# Patient Record
Sex: Female | Born: 1970 | Race: White | Hispanic: No | Marital: Married | Smoking: Former smoker
Health system: Southern US, Community
[De-identification: ages and names within clinical notes are randomized; demographics above are authoritative.]

## PROBLEM LIST (undated history)

## (undated) DIAGNOSIS — C801 Malignant (primary) neoplasm, unspecified: Secondary | ICD-10-CM

## (undated) DIAGNOSIS — F419 Anxiety disorder, unspecified: Secondary | ICD-10-CM

## (undated) DIAGNOSIS — Z8489 Family history of other specified conditions: Secondary | ICD-10-CM

## (undated) DIAGNOSIS — F32A Depression, unspecified: Secondary | ICD-10-CM

## (undated) DIAGNOSIS — F329 Major depressive disorder, single episode, unspecified: Secondary | ICD-10-CM

## (undated) DIAGNOSIS — J45909 Unspecified asthma, uncomplicated: Secondary | ICD-10-CM

---

## 1998-05-21 HISTORY — PX: WISDOM TOOTH EXTRACTION: SHX21

## 2001-09-03 ENCOUNTER — Other Ambulatory Visit: Admission: RE | Admit: 2001-09-03 | Discharge: 2001-09-03 | Payer: Self-pay | Admitting: *Deleted

## 2002-04-21 ENCOUNTER — Other Ambulatory Visit: Admission: RE | Admit: 2002-04-21 | Discharge: 2002-04-21 | Payer: Self-pay | Admitting: Obstetrics and Gynecology

## 2002-11-20 ENCOUNTER — Inpatient Hospital Stay (HOSPITAL_COMMUNITY): Admission: AD | Admit: 2002-11-20 | Discharge: 2002-11-22 | Payer: Self-pay | Admitting: Obstetrics and Gynecology

## 2003-01-01 ENCOUNTER — Other Ambulatory Visit: Admission: RE | Admit: 2003-01-01 | Discharge: 2003-01-01 | Payer: Self-pay | Admitting: Obstetrics and Gynecology

## 2005-02-09 ENCOUNTER — Inpatient Hospital Stay (HOSPITAL_COMMUNITY): Admission: AD | Admit: 2005-02-09 | Discharge: 2005-02-11 | Payer: Self-pay | Admitting: Obstetrics & Gynecology

## 2006-09-11 ENCOUNTER — Encounter: Admission: RE | Admit: 2006-09-11 | Discharge: 2006-09-11 | Payer: Self-pay | Admitting: Obstetrics and Gynecology

## 2007-04-01 ENCOUNTER — Encounter: Admission: RE | Admit: 2007-04-01 | Discharge: 2007-04-01 | Payer: Self-pay | Admitting: Family Medicine

## 2008-03-09 ENCOUNTER — Encounter: Admission: RE | Admit: 2008-03-09 | Discharge: 2008-03-09 | Payer: Self-pay | Admitting: Family Medicine

## 2008-03-17 ENCOUNTER — Ambulatory Visit (HOSPITAL_COMMUNITY): Admission: RE | Admit: 2008-03-17 | Discharge: 2008-03-17 | Payer: Self-pay | Admitting: Family Medicine

## 2010-05-16 ENCOUNTER — Encounter
Admission: RE | Admit: 2010-05-16 | Discharge: 2010-05-16 | Payer: Self-pay | Source: Home / Self Care | Attending: Obstetrics and Gynecology | Admitting: Obstetrics and Gynecology

## 2010-10-06 NOTE — H&P (Signed)
Lindsey Le, Lindsey Le                            ACCOUNT NO.:  [id number]   MEDICAL RECORD NO.:  [id number]                   PATIENT TYPE:  INP   LOCATION:  9174                                 FACILITY:  [facility]   PHYSICIAN:  Maxie Better, M.D.            DATE OF BIRTH:  09-06-1970   DATE OF ADMISSION:  11/20/2002  DATE OF DISCHARGE:                                HISTORY & PHYSICAL   CHIEF COMPLAINT:  Spontaneous rupture of membranes at 4 a.m., clear fluid.   HISTORY OF PRESENT ILLNESS:  This is a 40 year old gravida 1 para 0 married  white female; last menstrual period February 18, 2002; [facility] of November 26, 2002;  who is now at 39+ weeks gestation; admitted with spontaneous rupture of  membranes at 4 a.m. this morning.  Her group B strep culture is negative.  The patient had not have any formal perceived contractions.  She does note  low back pain and otherwise good fetal movements.  Her prenatal course has  been notable for size greater than dates with the last ultrasound done on  November 10, 2002 showed an estimated fetal weight of 7 pounds 12 ounces,  amniotic fluid index of 13.9.  Prenatal care is at [facility], primary  obstetrician Maxie Better, M.D.   PRENATAL LABORATORY DATA:  Blood type is A positive, antibody screen is  negative.  RPR is nonreactive.  Rubella is immune.  Hepatitis B surface  antigen is negative.  HIV test was negative.  GC and chlamydia cultures are  negative.  Pap was within normal limits.  AFP-3 test was normal on June 10, 2002.  Anatomic survey was done on July 14, 2002 with complete study  done on July 28, 2002.  Posterior placenta noted at that time.  Normal one-  hour glucose challenge test.  Group B strep culture negative.   PAST MEDICAL HISTORY:  1. Allergy to ERYTHROMYCIN and GRAPES.  2. Medicines: Prenatal vitamins.  3. Medical history:  Seasonal allergies, skin cancer removed from head.  4. Surgery:  Wisdom tooth extraction June  2001.   FAMILY HISTORY:  Maternal grandmother - breast cancer.  No ovarian or colon  cancer.  Paternal grandfather - lung cancer.   SOCIAL HISTORY:  Married, nonsmoker.  Coordinator at [employer]   REVIEW OF SYSTEMS:  Negative except as noted in history of present illness.   PHYSICAL EXAMINATION:  GENERAL:  Well-developed, well-nourished gravid white  female in no acute distress.  VITAL SIGNS:  Blood pressure 100/72, weight is 157, fetal heart rate of 138,  temperature of 98.5.  SKIN:  Shows no lesions.  HEENT:  Anicteric sclerae, pink conjunctivae, oropharynx negative.  HEART:  Regular rate and rhythm without murmur.  LUNGS:  Clear to auscultation.  BREASTS:  Soft and nontender, no palpable mass.  ABDOMEN:  Gravid, fundal height of 41 cm.  PELVIC:  Loose 4 cm, 70%, -  1.  Clear amniotic fluid noted.  EXTREMITIES:  Trace edema.   IMPRESSION:  Spontaneous rupture of membranes, term gestation.   PLAN:  1. Admission.  2. Routine admission labs.  3. Ambulate x1 hour; if no change, Pitocin augmentation.  4. Analgesics p.r.n.                                               Maxie Better, M.D.    [place]/MEDQ  D:  11/20/2002  T:  11/20/2002  Job:  [id number]

## 2013-12-04 ENCOUNTER — Other Ambulatory Visit: Payer: Self-pay

## 2013-12-04 DIAGNOSIS — Z1231 Encounter for screening mammogram for malignant neoplasm of breast: Secondary | ICD-10-CM

## 2013-12-21 ENCOUNTER — Ambulatory Visit
Admission: RE | Admit: 2013-12-21 | Discharge: 2013-12-21 | Disposition: A | Discharge status: Home / Self Care | Payer: BC Managed Care – PPO | Source: Ambulatory Visit

## 2013-12-21 ENCOUNTER — Encounter (INDEPENDENT_AMBULATORY_CARE_PROVIDER_SITE_OTHER): Payer: Self-pay

## 2013-12-21 DIAGNOSIS — Z1231 Encounter for screening mammogram for malignant neoplasm of breast: Secondary | ICD-10-CM

## 2017-08-13 ENCOUNTER — Other Ambulatory Visit: Payer: Self-pay | Admitting: Obstetrics and Gynecology

## 2017-08-21 ENCOUNTER — Other Ambulatory Visit: Payer: Self-pay

## 2017-08-21 ENCOUNTER — Encounter (HOSPITAL_COMMUNITY): Payer: Self-pay | Admitting: *Deleted

## 2017-08-30 ENCOUNTER — Ambulatory Visit (HOSPITAL_COMMUNITY): Payer: BLUE CROSS/BLUE SHIELD | Admitting: Registered Nurse

## 2017-08-30 ENCOUNTER — Encounter (HOSPITAL_COMMUNITY): Payer: Self-pay | Admitting: Emergency Medicine

## 2017-08-30 ENCOUNTER — Encounter (HOSPITAL_COMMUNITY)
Admission: RE | Disposition: A | Discharge status: Home / Self Care | Payer: Self-pay | Source: Ambulatory Visit | Attending: Obstetrics and Gynecology

## 2017-08-30 ENCOUNTER — Other Ambulatory Visit: Payer: Self-pay

## 2017-08-30 ENCOUNTER — Ambulatory Visit (HOSPITAL_COMMUNITY)
Admission: RE | Admit: 2017-08-30 | Discharge: 2017-08-30 | Disposition: A | Discharge status: Home / Self Care | Payer: BLUE CROSS/BLUE SHIELD | Source: Ambulatory Visit | Attending: Obstetrics and Gynecology | Admitting: Obstetrics and Gynecology

## 2017-08-30 DIAGNOSIS — F329 Major depressive disorder, single episode, unspecified: Secondary | ICD-10-CM | POA: Insufficient documentation

## 2017-08-30 DIAGNOSIS — N858 Other specified noninflammatory disorders of uterus: Secondary | ICD-10-CM | POA: Diagnosis not present

## 2017-08-30 DIAGNOSIS — Z79899 Other long term (current) drug therapy: Secondary | ICD-10-CM | POA: Insufficient documentation

## 2017-08-30 DIAGNOSIS — Z793 Long term (current) use of hormonal contraceptives: Secondary | ICD-10-CM | POA: Insufficient documentation

## 2017-08-30 DIAGNOSIS — J45909 Unspecified asthma, uncomplicated: Secondary | ICD-10-CM | POA: Insufficient documentation

## 2017-08-30 DIAGNOSIS — Z87891 Personal history of nicotine dependence: Secondary | ICD-10-CM | POA: Diagnosis not present

## 2017-08-30 DIAGNOSIS — Z85828 Personal history of other malignant neoplasm of skin: Secondary | ICD-10-CM | POA: Insufficient documentation

## 2017-08-30 DIAGNOSIS — N920 Excessive and frequent menstruation with regular cycle: Secondary | ICD-10-CM | POA: Diagnosis present

## 2017-08-30 DIAGNOSIS — F419 Anxiety disorder, unspecified: Secondary | ICD-10-CM | POA: Diagnosis not present

## 2017-08-30 DIAGNOSIS — D250 Submucous leiomyoma of uterus: Secondary | ICD-10-CM | POA: Diagnosis not present

## 2017-08-30 DIAGNOSIS — N921 Excessive and frequent menstruation with irregular cycle: Secondary | ICD-10-CM | POA: Insufficient documentation

## 2017-08-30 HISTORY — DX: Major depressive disorder, single episode, unspecified: F32.9

## 2017-08-30 HISTORY — DX: Unspecified asthma, uncomplicated: J45.909

## 2017-08-30 HISTORY — PX: DILITATION & CURRETTAGE/HYSTROSCOPY WITH NOVASURE ABLATION: SHX5568

## 2017-08-30 HISTORY — DX: Family history of other specified conditions: Z84.89

## 2017-08-30 HISTORY — DX: Malignant (primary) neoplasm, unspecified: C80.1

## 2017-08-30 HISTORY — DX: Anxiety disorder, unspecified: F41.9

## 2017-08-30 HISTORY — DX: Depression, unspecified: F32.A

## 2017-08-30 LAB — CBC
HEMATOCRIT: 42.2 % (ref 36.0–46.0)
HEMOGLOBIN: 14.1 g/dL (ref 12.0–15.0)
MCH: 30.7 pg (ref 26.0–34.0)
MCHC: 33.4 g/dL (ref 30.0–36.0)
MCV: 91.7 fL (ref 78.0–100.0)
PLATELETS: 295 10*3/uL (ref 150–400)
RBC: 4.6 MIL/uL (ref 3.87–5.11)
RDW: 12.9 % (ref 11.5–15.5)
WBC: 5.1 10*3/uL (ref 4.0–10.5)

## 2017-08-30 LAB — PREGNANCY, URINE: Preg Test, Ur: NEGATIVE

## 2017-08-30 SURGERY — DILATATION & CURETTAGE/HYSTEROSCOPY WITH NOVASURE ABLATION
Anesthesia: General

## 2017-08-30 MED ORDER — OXYCODONE HCL 5 MG/5ML PO SOLN: 5.0000 mg | Freq: Once | ORAL | Status: AC | PRN

## 2017-08-30 MED ORDER — MIDAZOLAM HCL 5 MG/5ML IJ SOLN
INTRAMUSCULAR | Status: DC | PRN
  Administered 2017-08-30: 2 mg via INTRAVENOUS

## 2017-08-30 MED ORDER — OXYCODONE HCL 5 MG PO TABS
5.0000 mg | ORAL_TABLET | Freq: Once | ORAL | Status: AC | PRN
  Administered 2017-08-30: 5 mg via ORAL

## 2017-08-30 MED ORDER — PROMETHAZINE HCL 25 MG/ML IJ SOLN: 6.2500 mg | INTRAMUSCULAR | Status: DC | PRN

## 2017-08-30 MED ORDER — FENTANYL CITRATE (PF) 100 MCG/2ML IJ SOLN
INTRAMUSCULAR | Status: DC | PRN
  Administered 2017-08-30 (×2): 25 ug via INTRAVENOUS
  Administered 2017-08-30: 50 ug via INTRAVENOUS

## 2017-08-30 MED ORDER — MIDAZOLAM HCL 2 MG/2ML IJ SOLN
INTRAMUSCULAR | Status: AC
  Filled 2017-08-30: qty 2

## 2017-08-30 MED ORDER — SCOPOLAMINE 1 MG/3DAYS TD PT72
MEDICATED_PATCH | TRANSDERMAL | Status: AC
  Administered 2017-08-30: 1.5 mg via TRANSDERMAL
  Filled 2017-08-30: qty 1

## 2017-08-30 MED ORDER — PROPOFOL 10 MG/ML IV BOLUS
INTRAVENOUS | Status: DC | PRN
  Administered 2017-08-30: 200 mg via INTRAVENOUS

## 2017-08-30 MED ORDER — MEPERIDINE HCL 25 MG/ML IJ SOLN: 6.2500 mg | INTRAMUSCULAR | Status: DC | PRN

## 2017-08-30 MED ORDER — LIDOCAINE 2% (20 MG/ML) 5 ML SYRINGE
INTRAMUSCULAR | Status: DC | PRN
  Administered 2017-08-30: 60 mg via INTRAVENOUS

## 2017-08-30 MED ORDER — OXYCODONE HCL 5 MG PO TABS
ORAL_TABLET | ORAL | Status: AC
  Filled 2017-08-30: qty 1

## 2017-08-30 MED ORDER — LIDOCAINE HCL (CARDIAC) 20 MG/ML IV SOLN
INTRAVENOUS | Status: AC
  Filled 2017-08-30: qty 5

## 2017-08-30 MED ORDER — HYDROMORPHONE HCL 1 MG/ML IJ SOLN
INTRAMUSCULAR | Status: AC
  Filled 2017-08-30: qty 0.5

## 2017-08-30 MED ORDER — KETOROLAC TROMETHAMINE 30 MG/ML IJ SOLN
INTRAMUSCULAR | Status: AC
  Filled 2017-08-30: qty 2

## 2017-08-30 MED ORDER — KETOROLAC TROMETHAMINE 30 MG/ML IJ SOLN
INTRAMUSCULAR | Status: DC | PRN
  Administered 2017-08-30: 30 mg via INTRAVENOUS
  Administered 2017-08-30: 30 mg via INTRAMUSCULAR

## 2017-08-30 MED ORDER — ONDANSETRON HCL 4 MG/2ML IJ SOLN
INTRAMUSCULAR | Status: DC | PRN
  Administered 2017-08-30: 4 mg via INTRAVENOUS

## 2017-08-30 MED ORDER — LACTATED RINGERS IV SOLN
INTRAVENOUS | Status: DC
  Administered 2017-08-30: 10:00:00 via INTRAVENOUS

## 2017-08-30 MED ORDER — ONDANSETRON HCL 4 MG/2ML IJ SOLN
INTRAMUSCULAR | Status: AC
  Filled 2017-08-30: qty 2

## 2017-08-30 MED ORDER — DEXAMETHASONE SODIUM PHOSPHATE 10 MG/ML IJ SOLN
INTRAMUSCULAR | Status: AC
Start: 2017-08-30 — End: ?
  Filled 2017-08-30: qty 1

## 2017-08-30 MED ORDER — PROPOFOL 10 MG/ML IV BOLUS
INTRAVENOUS | Status: AC
  Filled 2017-08-30: qty 20

## 2017-08-30 MED ORDER — HYDROMORPHONE HCL 1 MG/ML IJ SOLN
0.2500 mg | INTRAMUSCULAR | Status: DC | PRN
  Administered 2017-08-30 (×2): 0.5 mg via INTRAVENOUS

## 2017-08-30 MED ORDER — HYDROCODONE-ACETAMINOPHEN 5-325 MG PO TABS
1.0000 | ORAL_TABLET | Freq: Four times a day (QID) | ORAL | 0 refills | Status: AC | PRN
Start: 2017-08-30 — End: 2017-09-06

## 2017-08-30 MED ORDER — HYDROMORPHONE HCL 1 MG/ML IJ SOLN
INTRAMUSCULAR | Status: AC
  Administered 2017-08-30: 0.5 mg via INTRAVENOUS
  Filled 2017-08-30: qty 0.5

## 2017-08-30 MED ORDER — SCOPOLAMINE 1 MG/3DAYS TD PT72
1.0000 | MEDICATED_PATCH | Freq: Once | TRANSDERMAL | Status: DC
  Administered 2017-08-30: 1.5 mg via TRANSDERMAL

## 2017-08-30 MED ORDER — SODIUM CHLORIDE 0.9 % IR SOLN
Status: DC | PRN
  Administered 2017-08-30: 3000 mL

## 2017-08-30 MED ORDER — FENTANYL CITRATE (PF) 100 MCG/2ML IJ SOLN
INTRAMUSCULAR | Status: AC
  Filled 2017-08-30: qty 2

## 2017-08-30 SURGICAL SUPPLY — 13 items
ABLATOR ENDOMETRIAL BIPOLAR (ABLATOR) ×2 IMPLANT
CANISTER SUCT 3000ML PPV (MISCELLANEOUS) ×3 IMPLANT
CATH ROBINSON RED A/P 16FR (CATHETERS) ×3 IMPLANT
GLOVE BIOGEL PI IND STRL 7.0 (GLOVE) ×2 IMPLANT
GLOVE BIOGEL PI INDICATOR 7.0 (GLOVE) ×4
GLOVE ECLIPSE 6.5 STRL STRAW (GLOVE) ×3 IMPLANT
GOWN STRL REUS W/TWL LRG LVL3 (GOWN DISPOSABLE) ×6 IMPLANT
PACK VAGINAL MINOR WOMEN LF (CUSTOM PROCEDURE TRAY) ×3 IMPLANT
PAD OB MATERNITY 4.3X12.25 (PERSONAL CARE ITEMS) ×3 IMPLANT
SET GENESYS HTA PROCERVA (MISCELLANEOUS) ×2 IMPLANT
TOWEL OR 17X24 6PK STRL BLUE (TOWEL DISPOSABLE) ×4 IMPLANT
TUBING AQUILEX INFLOW (TUBING) ×3 IMPLANT
TUBING AQUILEX OUTFLOW (TUBING) ×3 IMPLANT

## 2017-08-30 NOTE — Anesthesia Procedure Notes (Signed)
Procedure Name: LMA Insertion Date/Time: 08/30/2017 10:38 AM Performed by: Talbot Grumbling, CRNA Pre-anesthesia Checklist: Patient identified, Emergency Drugs available, Suction available and Patient being monitored Patient Re-evaluated:Patient Re-evaluated prior to induction Oxygen Delivery Method: Circle system utilized Preoxygenation: Pre-oxygenation with 100% oxygen Induction Type: IV induction Ventilation: Mask ventilation without difficulty LMA: LMA inserted LMA Size: 4.0 Number of attempts: 1 Placement Confirmation: positive ETCO2 and breath sounds checked- equal and bilateral Tube secured with: Tape Dental Injury: Teeth and Oropharynx as per pre-operative assessment

## 2017-08-30 NOTE — H&P (Signed)
Lindsey Le is an 47 y.o. female.G2P2 MWF presents for dx hysteroscopy, novasure endometrial ablation due to menorrhagia. Pt has failed hormonal therapy. Benign endom bx. Nl sonogram  Pertinent Gynecological History: Menses: menorrhagia Bleeding: heavy Contraception: OCP (estrogen/progesterone) DES exposure: denies Blood transfusions: none Sexually transmitted diseases: no past history Previous GYN Procedures: n/a  Last mammogram: normal Date: 2018 Last pap: normal Date: 2018 OB History: G2P2   Menstrual History: Menarche age: n/a Patient's last menstrual period was 08/21/2017.    Past Medical History:  Diagnosis Date  . Anxiety   . Asthma   . Cancer ([place])    skin removed with laser surgery  . Depression   . Family history of adverse reaction to anesthesia    mother has had nausea after surgery    Past Surgical History:  Procedure Laterality Date  . [place] EXTRACTION  2000    History reviewed. No pertinent family history.  Social History:  reports that she has quit smoking. Her smoking use included cigarettes. She has a 0.25 pack-year smoking history. She has never used smokeless tobacco. She reports that she does not use drugs. Her alcohol history is not on file.  Allergies:  Allergies  Allergen Reactions  . Prednisone Other (See Comments)    Hallucinations, blurred vision   . Erythromycin Nausea And Vomiting  . Grapeseed Extract [Nutritional Supplements] Hives  . Lactose Intolerance (Gi) Diarrhea    Gas, upset GI  . Omnicef [Cefdinir] Hives    Itchy bones  . Other Hives    Grapes - fruit, stomach cramps   . Pollinex-T [Modified Tree Tyrosine Adsorbate]     Medications Prior to Admission  Medication Sig Dispense Refill Last Dose  . acetaminophen (TYLENOL) 500 MG tablet Take 1,000 mg by mouth every 6 (six) hours as needed for moderate pain or headache.   08/29/2017 at Unknown time  . albuterol (PROVENTIL HFA;VENTOLIN HFA) 108 (90 Base) MCG/ACT inhaler  Inhale 2 puffs into the lungs every 6 (six) hours as needed for wheezing or shortness of breath.   Past Week at Unknown time  . ALPRAZolam (XANAX) 0.5 MG tablet Take 0.5 mg by mouth as needed for anxiety.    Past Month at Unknown time  . calcium carbonate (TUMS - DOSED IN MG ELEMENTAL CALCIUM) 500 MG chewable tablet Chew 1-2 tablets by mouth daily as needed for indigestion or heartburn.   Past Month at Unknown time  . fexofenadine (ALLEGRA) 180 MG tablet Take 180 mg by mouth daily.   08/29/2017 at Unknown time  . lactase (LACTAID) 3000 units tablet Take 6,000 Units by mouth daily as needed (lactose intolerance).    08/29/2017 at Unknown time  . levonorgestrel-ethinyl estradiol (AVIANE,ALESSE,LESSINA) 0.1-20 MG-MCG tablet Take 1 tablet by mouth at bedtime.    08/29/2017 at Unknown time  . montelukast (SINGULAIR) 10 MG tablet Take 10 mg by mouth at bedtime.   08/29/2017 at Unknown time  . Multiple Vitamin (MULTIVITAMIN WITH MINERALS) TABS tablet Take 1 tablet by mouth at bedtime.   08/29/2017 at Unknown time  . sertraline (ZOLOFT) 25 MG tablet Take 25 mg by mouth at bedtime.   08/29/2017 at Unknown time  . Triamcinolone Acetonide (NASACORT ALLERGY 24HR NA) Place 1 spray into the nose daily as needed (allergies).   More than a month at Unknown time    Review of Systems  All other systems reviewed and are negative.   Blood pressure 134/76, pulse 82, temperature 98.4 F (36.9 C), temperature source Oral,  resp. rate 16, height 5\' 4"  (1.626 m), weight 67.1 kg (148 lb), last menstrual period 08/21/2017, SpO2 100 %. Physical Exam  Constitutional: She is oriented to person, place, and time. She appears well-developed and well-nourished.  HENT:  Head: Atraumatic.  Eyes: EOM are normal.  Neck: Normal range of motion.  Respiratory: Breath sounds normal.  GI: Soft.  Genitourinary: Vagina normal and uterus normal.  Genitourinary Comments: Cervix nl Adnexa nl  Neurological: She is alert and oriented to  person, place, and time.  Skin: Skin is warm and dry.  Psychiatric: She has a normal mood and affect.    Results for orders placed or performed during the hospital encounter of 08/30/17 (from the past 24 hour(s))  Pregnancy, urine     Status: None   Collection Time: 08/30/17  8:59 AM  Result Value Ref Range   Preg Test, Ur NEGATIVE NEGATIVE  CBC     Status: None   Collection Time: 08/30/17  9:00 AM  Result Value Ref Range   WBC 5.1 4.0 - 10.5 K/uL   RBC 4.60 3.87 - 5.11 MIL/uL   Hemoglobin 14.1 12.0 - 15.0 g/dL   HCT 42.2 36.0 - 46.0 %   MCV 91.7 78.0 - 100.0 fL   MCH 30.7 26.0 - 34.0 pg   MCHC 33.4 30.0 - 36.0 g/dL   RDW 12.9 11.5 - 15.5 %   Platelets 295 150 - 400 K/uL    No results found.  Assessment/Plan: Menorrhagia P) dx hysteroscopy, novasure endom ablation. Risk of surgery includes infection, bleeding, uterine perforation and its risk, injuryt to surrounding organ structures, fluid overload. 90% reduction in flow. All ? answered Lindsey Le A Lindsey Le 08/30/2017, 9:32 AM

## 2017-08-30 NOTE — Transfer of Care (Signed)
Immediate Anesthesia Transfer of Care Note  Patient: Lindsey Le  Procedure(s) Performed: DILATATION & CURETTAGE/HYSTEROSCOPY WITH ENDOMETRIAL  ABLATION (N/A )  Patient Location: PACU  Anesthesia Type:General  Level of Consciousness: sedated  Airway & Oxygen Therapy: Patient Spontanous Breathing and Patient connected to nasal cannula oxygen  Post-op Assessment: Report given to RN and Post -op Vital signs reviewed and stable  Post vital signs: Reviewed and stable  Last Vitals:  Vitals Value Taken Time  BP 126/79 08/30/2017 12:58 PM  Temp    Pulse 73 08/30/2017  1:00 PM  Resp 13 08/30/2017  1:00 PM  SpO2 97 % 08/30/2017  1:00 PM  Vitals shown include unvalidated device data.  Last Pain:  Vitals:   08/30/17 1258  TempSrc:   PainSc: 5       Patients Stated Pain Goal: 5 (53/00/51 1021)  Complications: No apparent anesthesia complications

## 2017-08-30 NOTE — Anesthesia Postprocedure Evaluation (Signed)
Anesthesia Post Note  Patient: Lindsey Le  Procedure(s) Performed: DILATATION & CURETTAGE/HYSTEROSCOPY WITH ENDOMETRIAL  ABLATION (N/A )     Patient location during evaluation: PACU Anesthesia Type: General Level of consciousness: awake and alert Pain management: pain level controlled Vital Signs Assessment: post-procedure vital signs reviewed and stable Respiratory status: spontaneous breathing, nonlabored ventilation and respiratory function stable Cardiovascular status: blood pressure returned to baseline and stable Postop Assessment: no apparent nausea or vomiting Anesthetic complications: no    Last Vitals:  Vitals:   08/30/17 1258 08/30/17 1338  BP: 126/79 134/80  Pulse: 77 72  Resp: 12 16  Temp:    SpO2: 98% 97%    Last Pain:  Vitals:   08/30/17 1258  TempSrc:   PainSc: 5    Pain Goal: Patients Stated Pain Goal: 5 (08/30/17 0926)               Lynda Rainwater

## 2017-08-30 NOTE — Brief Op Note (Signed)
08/30/2017  12:10 PM  PATIENT:  Lindsey Le  47 y.o. female  PRE-OPERATIVE DIAGNOSIS:  Menorrhagia with regular cycles POST-OPERATIVE DIAGNOSIS:  Menorrhagia with regular cycles, SM fibroid  PROCEDURE:  Dx hysteroscopy, endometrial ablation  SURGEON:  Surgeon(s) and Role:    * Servando Salina, MD - Primary  PHYSICIAN ASSISTANT:   ASSISTANTS: none   ANESTHESIA:   general FINDINGS; ant LUS fibroid EBL:  15 mL   BLOOD ADMINISTERED:none  DRAINS: none   LOCAL MEDICATIONS USED:  NONE  SPECIMEN:  No Specimen  DISPOSITION OF SPECIMEN:  N/A  COUNTS:  YES  TOURNIQUET:  * No tourniquets in log *  DICTATION: 016010  PLAN OF CARE: Discharge to home after PACU  PATIENT DISPOSITION:  PACU - hemodynamically stable.   Delay start of Pharmacological VTE agent (>24hrs) due to surgical blood loss or risk of bleeding: no

## 2017-08-30 NOTE — Discharge Instructions (Signed)
DISCHARGE INSTRUCTIONS: HYSTEROSCOPY / ENDOMETRIAL ABLATION The following instructions have been prepared to help you care for yourself upon your return home.  May Remove Scop patch on or before Monday  May take Ibuprofen after 5 PM today.  May take stool softner while taking narcotic pain medication to prevent constipation.  Drink plenty of water.  Personal hygiene:  Use sanitary pads for vaginal drainage, not tampons.  Shower the day after your procedure.  NO tub baths, pools or Jacuzzis for 2-3 weeks.  Wipe front to back after using the bathroom.  Activity and limitations:  Do NOT drive or operate any equipment for 24 hours. The effects of anesthesia are still present and drowsiness may result.  Do NOT rest in bed all day.  Walking is encouraged.  Walk up and down stairs slowly.  You may resume your normal activity in one to two days or as indicated by your physician. Sexual activity: NO intercourse for at least 2 weeks after the procedure, or as indicated by your Doctor.  Diet: Eat a light meal as desired this evening. You may resume your usual diet tomorrow.  Return to Work: You may resume your work activities in one to two days or as indicated by Marine scientist.  What to expect after your surgery: Expect to have vaginal bleeding/discharge for 2-3 days and spotting for up to 10 days. It is not unusual to have soreness for up to 1-2 weeks. You may have a slight burning sensation when you urinate for the first day. Mild cramps may continue for a couple of days. You may have a regular period in 2-6 weeks.  Call your doctor for any of the following:  Excessive vaginal bleeding or clotting, saturating and changing one pad every hour.  Inability to urinate 6 hours after discharge from hospital.  Pain not relieved by pain medication.  Fever of 100.4 F or greater.  Unusual vaginal discharge or odor.   Post Anesthesia Home Care Instructions  Activity: Get plenty  of rest for the remainder of the day. A responsible individual must stay with you for 24 hours following the procedure.  For the next 24 hours, DO NOT: -Drive a car -Paediatric nurse -Drink alcoholic beverages -Take any medication unless instructed by your physician -Make any legal decisions or sign important papers.  Meals: Start with liquid foods such as gelatin or soup. Progress to regular foods as tolerated. Avoid greasy, spicy, heavy foods. If nausea and/or vomiting occur, drink only clear liquids until the nausea and/or vomiting subsides. Call your physician if vomiting continues.  Special Instructions/Symptoms: Your throat may feel dry or sore from the anesthesia or the breathing tube placed in your throat during surgery. If this causes discomfort, gargle with warm salt water. The discomfort should disappear within 24 hours.  If you had a scopolamine patch placed behind your ear for the management of post- operative nausea and/or vomiting:  1. The medication in the patch is effective for 72 hours, after which it should be removed.  Wrap patch in a tissue and discard in the trash. Wash hands thoroughly with soap and water. 2. You may remove the patch earlier than 72 hours if you experience unpleasant side effects which may include dry mouth, dizziness or visual disturbances. 3. Avoid touching the patch. Wash your hands with soap and water after contact with the patch.

## 2017-08-30 NOTE — Anesthesia Preprocedure Evaluation (Signed)
Anesthesia Evaluation  Patient identified by MRN, date of birth, ID band Patient awake    Reviewed: Allergy & Precautions, NPO status , Patient's Chart, lab work & pertinent test results  Airway Mallampati: II  TM Distance: >3 FB Neck ROM: Full    Dental no notable dental hx.    Pulmonary neg pulmonary ROS, asthma , former smoker,    Pulmonary exam normal breath sounds clear to auscultation       Cardiovascular negative cardio ROS Normal cardiovascular exam Rhythm:Regular Rate:Normal     Neuro/Psych Anxiety Depression negative neurological ROS  negative psych ROS   GI/Hepatic negative GI ROS, Neg liver ROS,   Endo/Other  negative endocrine ROS  Renal/GU negative Renal ROS  negative genitourinary   Musculoskeletal negative musculoskeletal ROS (+)   Abdominal   Peds negative pediatric ROS (+)  Hematology negative hematology ROS (+)   Anesthesia Other Findings   Reproductive/Obstetrics negative OB ROS                             Anesthesia Physical Anesthesia Plan  ASA: II  Anesthesia Plan: General   Post-op Pain Management:    Induction: Intravenous  PONV Risk Score and Plan: 3 and Ondansetron, Midazolam, Scopolamine patch - Pre-op and Treatment may vary due to age or medical condition  Airway Management Planned: LMA  Additional Equipment:   Intra-op Plan:   Post-operative Plan: Extubation in OR  Informed Consent: I have reviewed the patients History and Physical, chart, labs and discussed the procedure including the risks, benefits and alternatives for the proposed anesthesia with the patient or authorized representative who has indicated his/her understanding and acceptance.   Dental advisory given  Plan Discussed with: CRNA  Anesthesia Plan Comments:         Anesthesia Quick Evaluation

## 2017-08-31 ENCOUNTER — Encounter (HOSPITAL_COMMUNITY): Payer: Self-pay | Admitting: Obstetrics and Gynecology

## 2017-09-02 NOTE — Op Note (Signed)
NAME:  Lindsey Le, Lindsey Le                     ACCOUNT NO.:  MEDICAL RECORD NO.:  [id number]  LOCATION:                                 FACILITY:  PHYSICIAN:  Servando Salina, M.D.    DATE OF BIRTH:  DATE OF PROCEDURE:  08/30/2017 DATE OF DISCHARGE:                              OPERATIVE REPORT   PREOPERATIVE DIAGNOSES: 1. Menorrhagia with regular cycles.  PROCEDURE:  Diagnostic hysteroscopy, hydrothermal ablation, NovaSure endometrial ablation.  POSTOPERATIVE DIAGNOSES: 1. Menorrhagia with regular cycles. 2; Submucosal fibroid  ANESTHESIA:  General.  SURGEON:  Servando Salina, M.D.  ASSISTANT:  None.  DESCRIPTION OF PROCEDURE:  Under adequate general anesthesia, the patient was placed in the dorsal lithotomy position.  She was sterilely prepped and draped in usual fashion.  Bladder was catheterized for moderate amount of urine.  Examination under anesthesia revealed a retroverted uterus.  No adnexal masses could be appreciated.  A bivalve speculum was placed in vagina.  Single-tooth tenaculum was placed on the anterior lip of the cervix.  With a planned hydrothermal ablation due to the malfunctioning of the NovaSure apparatus, the hysteroscope was inserted.  Panoramic inspection showed both tubal ostia, but no other lesions initially.  After adjustments, the hydrothermal ablation apparatus was now working, and following the recommended guidelines, 10 minutes of ablation was done.  At that point after cooling, the uterine cavity was inspected.  However, there was maybe a small focal area on the anterior wall of blanching, but otherwise, the cavity appeared not to have been ablated at all.  After much contemplation and the NovaSure apparatus now being available having been fixed, decision was then made to proceed using the NovaSure apparatus.  The uterus was sounded to 9 cm.  Cervical length was 3 cm, and the NovaSure apparatus was inserted as cervical width of 3.8 was  noted.  With a power of 125, 44 seconds of ablation was accomplished.  The instrument was removed.  The diagnostic hysteroscope was then inserted.  Excellent endometrial ablation was noted throughout.  At the lower uterine segment anteriorly was a small submucosal fibroid that was not previously noted.  All instruments were then removed from the vagina.  SPECIMEN:  None.  ESTIMATED BLOOD LOSS:  Minimal.  COMPLICATIONS:  None.  The patient tolerated the procedure well, was transferred to recovery room in stable condition.     Servando Salina, M.D.     [place]/MEDQ  D:  08/30/2017  T:  08/31/2017  Job:  [id number]

## 2019-03-17 ENCOUNTER — Other Ambulatory Visit: Payer: Self-pay | Admitting: Obstetrics and Gynecology

## 2019-03-17 DIAGNOSIS — D250 Submucous leiomyoma of uterus: Secondary | ICD-10-CM

## 2019-03-19 ENCOUNTER — Other Ambulatory Visit: Payer: Self-pay

## 2019-03-19 ENCOUNTER — Ambulatory Visit
Admission: RE | Admit: 2019-03-19 | Discharge: 2019-03-19 | Disposition: A | Discharge status: Home / Self Care | Payer: BLUE CROSS/BLUE SHIELD | Source: Ambulatory Visit | Attending: Obstetrics and Gynecology | Admitting: Obstetrics and Gynecology

## 2019-03-19 ENCOUNTER — Encounter: Payer: Self-pay | Admitting: *Deleted

## 2019-03-19 ENCOUNTER — Other Ambulatory Visit: Payer: Self-pay | Admitting: Obstetrics and Gynecology

## 2019-03-19 DIAGNOSIS — D250 Submucous leiomyoma of uterus: Secondary | ICD-10-CM

## 2019-03-19 DIAGNOSIS — D259 Leiomyoma of uterus, unspecified: Secondary | ICD-10-CM

## 2019-03-19 HISTORY — PX: IR RADIOLOGIST EVAL & MGMT: IMG5224

## 2019-03-19 NOTE — Consult Note (Signed)
Chief Complaint: Symptomatic uterine fibroids    Referring Physician(s): Cousins,Sheronette  History of Present Illness: Lindsey Le 48 y.o. female presenting today as a scheduled consultation for symptomatic uterine fibroids, kindly referred by Dr. Garwin Brothers.  She joins [place] today by telemedicine, given the current COVID situation.  We confirmed her identity with 2 identifiers.   She complains of all 3 of 3 categories of symptoms of fibroids, bleeding, bulk and pain. Her primary complaint is bleeding.   She tells me that regarding her bleeding symptoms, she has had worsening over the past 9 months, though she has had bleeding previously before an ablation was performed that temporized her symptoms.  Her bleeding cycles are currently monthly, last at least 2 weeks, with at least 4 days of heavy bleeding, and have been getting progressively worse.  She complains of heavy bleeding with clotting, with pad/tampon changes very frequently.   She has never required a blood transfusion.  Regarding pain, she describes intensity of 5/10 pain, with quality described as crampy stabbing pain, treated with aleve/advil.   Regarding bulk symptoms mostly her symptoms are related to constipation.   She has 2 teenage boys, and says that she has no intention of further pregnancy.   She has discussed hormone therapy and surgical therapy with Dr. Garwin Brothers, and would prefer not to use either as further strategy.   Past Medical History:  Diagnosis Date  . Anxiety   . Asthma   . Cancer ([place])    skin removed with laser surgery  . Depression   . Family history of adverse reaction to anesthesia    mother has had nausea after surgery    Past Surgical History:  Procedure Laterality Date  . DILITATION & CURRETTAGE/HYSTROSCOPY WITH NOVASURE ABLATION N/A 08/30/2017   Procedure: DILATATION & CURETTAGE/HYSTEROSCOPY WITH ENDOMETRIAL  ABLATION;  Surgeon: Servando Salina, MD;  Location: [facility];  Service:  Gynecology;  Laterality: N/A;  . IR RADIOLOGIST EVAL & MGMT  03/19/2019  . WISDOM TOOTH EXTRACTION  2000    Allergies: Prednisone, Erythromycin, Grapeseed extract [nutritional supplements], Lactose intolerance (gi), Omnicef [cefdinir], Other, and Pollinex-t [modified tree tyrosine adsorbate]  Medications: Prior to Admission medications   Medication Sig Start Date End Date Taking? Authorizing Provider  albuterol (PROVENTIL HFA;VENTOLIN HFA) 108 (90 Base) MCG/ACT inhaler Inhale 2 puffs into the lungs every 6 (six) hours as needed for wheezing or shortness of breath.    [provider]  ALPRAZolam Duanne Moron) 0.5 MG tablet Take 0.5 mg by mouth as needed for anxiety.     [provider]  calcium carbonate (TUMS - DOSED IN MG ELEMENTAL CALCIUM) 500 MG chewable tablet Chew 1-2 tablets by mouth daily as needed for indigestion or heartburn.    [provider]  fexofenadine (ALLEGRA) 180 MG tablet Take 180 mg by mouth daily.    [provider]  lactase (LACTAID) 3000 units tablet Take 6,000 Units by mouth daily as needed (lactose intolerance).     [provider]  levonorgestrel-ethinyl estradiol (AVIANE,ALESSE,LESSINA) 0.1-20 MG-MCG tablet Take 1 tablet by mouth at bedtime.     [provider]  montelukast (SINGULAIR) 10 MG tablet Take 10 mg by mouth at bedtime.    [provider]  Multiple Vitamin (MULTIVITAMIN WITH MINERALS) TABS tablet Take 1 tablet by mouth at bedtime.    [provider]  sertraline (ZOLOFT) 25 MG tablet Take 25 mg by mouth at bedtime.    [provider]  Triamcinolone Acetonide (NASACORT ALLERGY  24HR NA) Place 1 spray into the nose daily as needed (allergies).    [provider]     No family history on file.  Social History   Socioeconomic History  . Marital status: Married    Spouse name: Not on file  . Number of children: Not on file  . Years of education: Not on file  . Highest  education level: Not on file  Occupational History  . Not on file  Social Needs  . Financial resource strain: Not on file  . Food insecurity    Worry: Not on file    Inability: Not on file  . Transportation needs    Medical: Not on file    Non-medical: Not on file  Tobacco Use  . Smoking status: Former Smoker    Packs/day: 0.25    Years: 1.00    Pack years: 0.25    Types: Cigarettes  . Smokeless tobacco: Never Used  Substance and Sexual Activity  . Alcohol use: Not on file    Comment: occasional  . Drug use: Never  . Sexual activity: Not on file  Lifestyle  . Physical activity    Days per week: Not on file    Minutes per session: Not on file  . Stress: Not on file  Relationships  . Social Herbalist on phone: Not on file    Gets together: Not on file    Attends religious service: Not on file    Active member of club or organization: Not on file    Attends meetings of clubs or organizations: Not on file    Relationship status: Not on file  Other Topics Concern  . Not on file  Social History Narrative  . Not on file       Review of Systems  Review of Systems: A 12 point ROS discussed and pertinent positives are indicated in the HPI above.  All other systems are negative.  Physical Exam No direct physical exam was performed (except for noted visual exam findings with Video Visits).    Vital Signs: There were no vitals taken for this visit.  Imaging: Ir Radiologist Eval & Mgmt  Result Date: 03/19/2019 Please refer to notes tab for details about interventional procedure. (Op Note)   Labs:  CBC: No results for input(s): WBC, HGB, HCT, PLT in the last 8760 hours.  COAGS: No results for input(s): INR, APTT in the last 8760 hours.  BMP: No results for input(s): NA, K, CL, CO2, GLUCOSE, BUN, CALCIUM, CREATININE, GFRNONAA, GFRAA in the last 8760 hours.  Invalid input(s): CMP  LIVER FUNCTION TESTS: No results for input(s): BILITOT, AST, ALT,  ALKPHOS, PROT, ALBUMIN in the last 8760 hours.  TUMOR MARKERS: No results for input(s): AFPTM, CEA, CA199, CHROMGRNA in the last 8760 hours.  Assessment and Plan:  Lindsey Le is a 48 yo female with symptomatic uterine fibroids, and is a candidate for uterine artery embolization.  She prefers not to undergo traditional surgical therapy, and would like not to use hormonal therapy.   I had a lengthy discussion with her regarding the anatomy, pathology, and treatment options for fibroids.  Specifically, I mentioned hormonal therapy, surgical hysterectomy/myomectomy, uterine fibroid embolization, and HIFU.    Regarding UFE, we had a discussion regarding the efficacy, expectations regarding outcomes/long term efficacy, and risk/benefit.   I shared with her a summary of the literature for UFE efficacy, which generally is accepted to be adequate symptom relief with good restoration  of quality of life at 1 year in 90% or greater of patients for bleeding>pain> bulk symptoms.  I also told her that there is a cited rate of retreatment in ~15-20% of patients in the long term, with overall excellent long term utility of symptom relief and QOL.    We discussed the expectations not just for the treatment day/23 hour observation, but the first 3 months, 6 months - 12 months, and our follow up.   Regarding risks, specific risks discussed include: post-embolization syndrome, bleeding, infection, contrast reaction, kidney/artery injury, need for further surgery/procedure, including hysterectomy, need for hospitalization, cardiopulmonary collapse, death.    Regarding post-embolization syndrome, I did let her know that this is essentially expected, with a typical prodromal syndrome lasting 4-7 days typically, and usually treated with medication/support such as hydration, rest, analgesics, PO nausea medications, and stool softeners.   We also discussed the need for MRI and possibility that she may be excluded by  findings, or possibly requiring a follow up conversation if we feel that adenomyosis is present/contributing.  After discussing, she would like to proceed with treatment.    Plan: - plan for MRI to evaluate uterus/fibroids - plan tentatively to proceed with uterine artery embolization at first available, with Dr. Earleen Newport.   - my plan is to discuss possible hypogastric nerve block at the time of our treatment   Thank you for this interesting consult.  I greatly enjoyed meeting Lindsey Le and look forward to participating in their care.  A copy of this report was sent to the requesting provider on this date.  Electronically Signed: Corrie Mckusick 03/19/2019, 3:43 PM   I spent a total of  40 Minutes   in remote  clinical consultation, greater than 50% of which was counseling/coordinating care for symptomatic uterine fibroids, possible uterine fibroid embolization.    Visit type: Audio only (telephone). Audio (no video) only due to no video. Alternative for in-person consultation at [facility], [address], [place], [place]. This visit type was conducted due to national recommendations for restrictions regarding the COVID-19 Pandemic (e.g. social distancing).  This format is felt to be most appropriate for this patient at this time.  All issues noted in this document were discussed and addressed.

## 2019-03-27 ENCOUNTER — Other Ambulatory Visit: Payer: Self-pay

## 2019-03-27 ENCOUNTER — Ambulatory Visit (HOSPITAL_COMMUNITY)
Admission: RE | Admit: 2019-03-27 | Discharge: 2019-03-27 | Disposition: A | Discharge status: Home / Self Care | Payer: BC Managed Care – PPO | Source: Ambulatory Visit | Attending: Obstetrics and Gynecology | Admitting: Obstetrics and Gynecology

## 2019-03-27 DIAGNOSIS — D259 Leiomyoma of uterus, unspecified: Secondary | ICD-10-CM | POA: Diagnosis present

## 2019-03-27 MED ORDER — GADOBUTROL 1 MMOL/ML IV SOLN
7.5000 mL | Freq: Once | INTRAVENOUS | Status: AC | PRN
  Administered 2019-03-27: 7.5 mL via INTRAVENOUS

## 2019-04-01 ENCOUNTER — Other Ambulatory Visit (HOSPITAL_COMMUNITY): Payer: Self-pay | Admitting: Interventional Radiology

## 2019-04-01 DIAGNOSIS — D259 Leiomyoma of uterus, unspecified: Secondary | ICD-10-CM

## 2019-04-27 ENCOUNTER — Other Ambulatory Visit (HOSPITAL_COMMUNITY)
Admission: RE | Admit: 2019-04-27 | Discharge: 2019-04-27 | Disposition: A | Discharge status: Home / Self Care | Payer: BC Managed Care – PPO | Source: Ambulatory Visit | Attending: Interventional Radiology | Admitting: Interventional Radiology

## 2019-04-27 DIAGNOSIS — Z01812 Encounter for preprocedural laboratory examination: Secondary | ICD-10-CM | POA: Insufficient documentation

## 2019-04-27 DIAGNOSIS — Z20828 Contact with and (suspected) exposure to other viral communicable diseases: Secondary | ICD-10-CM | POA: Diagnosis not present

## 2019-04-28 LAB — NOVEL CORONAVIRUS, NAA (HOSP ORDER, SEND-OUT TO REF LAB; TAT 18-24 HRS): SARS-CoV-2, NAA: NOT DETECTED

## 2019-04-29 ENCOUNTER — Other Ambulatory Visit: Payer: Self-pay | Admitting: Radiology

## 2019-04-30 ENCOUNTER — Ambulatory Visit (HOSPITAL_COMMUNITY)
Admission: RE | Admit: 2019-04-30 | Discharge: 2019-04-30 | Disposition: A | Discharge status: Home / Self Care | Payer: BC Managed Care – PPO | Source: Ambulatory Visit | Attending: Interventional Radiology | Admitting: Interventional Radiology

## 2019-04-30 ENCOUNTER — Encounter (HOSPITAL_COMMUNITY): Payer: Self-pay

## 2019-04-30 ENCOUNTER — Other Ambulatory Visit: Payer: Self-pay

## 2019-04-30 ENCOUNTER — Observation Stay (HOSPITAL_COMMUNITY)
Admission: RE | Admit: 2019-04-30 | Discharge: 2019-05-01 | Disposition: A | Discharge status: Home / Self Care | Payer: BC Managed Care – PPO | Source: Ambulatory Visit | Attending: Interventional Radiology | Admitting: Interventional Radiology

## 2019-04-30 DIAGNOSIS — D219 Benign neoplasm of connective and other soft tissue, unspecified: Secondary | ICD-10-CM | POA: Diagnosis present

## 2019-04-30 DIAGNOSIS — F419 Anxiety disorder, unspecified: Secondary | ICD-10-CM | POA: Insufficient documentation

## 2019-04-30 DIAGNOSIS — Z23 Encounter for immunization: Secondary | ICD-10-CM | POA: Insufficient documentation

## 2019-04-30 DIAGNOSIS — F329 Major depressive disorder, single episode, unspecified: Secondary | ICD-10-CM | POA: Insufficient documentation

## 2019-04-30 DIAGNOSIS — J45909 Unspecified asthma, uncomplicated: Secondary | ICD-10-CM | POA: Diagnosis not present

## 2019-04-30 DIAGNOSIS — Z79899 Other long term (current) drug therapy: Secondary | ICD-10-CM | POA: Insufficient documentation

## 2019-04-30 DIAGNOSIS — D259 Leiomyoma of uterus, unspecified: Principal | ICD-10-CM | POA: Insufficient documentation

## 2019-04-30 DIAGNOSIS — Z79891 Long term (current) use of opiate analgesic: Secondary | ICD-10-CM | POA: Insufficient documentation

## 2019-04-30 HISTORY — PX: IR EMBO TUMOR ORGAN ISCHEMIA INFARCT INC GUIDE ROADMAPPING: IMG5449

## 2019-04-30 HISTORY — PX: IR ANGIOGRAM PELVIS SELECTIVE OR SUPRASELECTIVE: IMG661

## 2019-04-30 HISTORY — PX: IR FLUORO GUIDED NEEDLE PLC ASPIRATION/INJECTION LOC: IMG2395

## 2019-04-30 HISTORY — PX: IR ANGIOGRAM SELECTIVE EACH ADDITIONAL VESSEL: IMG667

## 2019-04-30 HISTORY — PX: IR US GUIDE VASC ACCESS RIGHT: IMG2390

## 2019-04-30 LAB — CBC WITH DIFFERENTIAL/PLATELET
Abs Immature Granulocytes: 0 10*3/uL (ref 0.00–0.07)
Basophils Absolute: 0 10*3/uL (ref 0.0–0.1)
Basophils Relative: 1 %
Eosinophils Absolute: 0.1 10*3/uL (ref 0.0–0.5)
Eosinophils Relative: 3 %
HCT: 38.4 % (ref 36.0–46.0)
Hemoglobin: 12.2 g/dL (ref 12.0–15.0)
Immature Granulocytes: 0 %
Lymphocytes Relative: 39 %
Lymphs Abs: 1.8 10*3/uL (ref 0.7–4.0)
MCH: 29.7 pg (ref 26.0–34.0)
MCHC: 31.8 g/dL (ref 30.0–36.0)
MCV: 93.4 fL (ref 80.0–100.0)
Monocytes Absolute: 0.3 10*3/uL (ref 0.1–1.0)
Monocytes Relative: 7 %
Neutro Abs: 2.3 10*3/uL (ref 1.7–7.7)
Neutrophils Relative %: 50 %
Platelets: 299 10*3/uL (ref 150–400)
RBC: 4.11 MIL/uL (ref 3.87–5.11)
RDW: 12.9 % (ref 11.5–15.5)
WBC: 4.6 10*3/uL (ref 4.0–10.5)
nRBC: 0 % (ref 0.0–0.2)

## 2019-04-30 LAB — PROTIME-INR
INR: 1.1 (ref 0.8–1.2)
Prothrombin Time: 13.6 seconds (ref 11.4–15.2)

## 2019-04-30 LAB — BASIC METABOLIC PANEL
Anion gap: 11 (ref 5–15)
BUN: 15 mg/dL (ref 6–20)
CO2: 21 mmol/L — ABNORMAL LOW (ref 22–32)
Calcium: 8.7 mg/dL — ABNORMAL LOW (ref 8.9–10.3)
Chloride: 107 mmol/L (ref 98–111)
Creatinine, Ser: 0.75 mg/dL (ref 0.44–1.00)
GFR calc Af Amer: >60 mL/min (ref >60)
GFR calc non Af Amer: >60 mL/min (ref >60)
Glucose, Bld: 90 mg/dL (ref 70–99)
Potassium: 4.8 mmol/L (ref 3.5–5.1)
Sodium: 139 mmol/L (ref 135–145)

## 2019-04-30 LAB — TYPE AND SCREEN
ABO/RH(D): A POS
Antibody Screen: NEGATIVE

## 2019-04-30 LAB — ABO/RH: ABO/RH(D): A POS

## 2019-04-30 LAB — HCG, SERUM, QUALITATIVE: Preg, Serum: NEGATIVE

## 2019-04-30 MED ORDER — LIDOCAINE HCL (PF) 1 % IJ SOLN
INTRAMUSCULAR | Status: AC | PRN
  Administered 2019-04-30: 5 mL

## 2019-04-30 MED ORDER — MIDAZOLAM BOLUS VIA INFUSION: 1.0000 mg | Freq: Once | INTRAVENOUS | Status: DC

## 2019-04-30 MED ORDER — CLINDAMYCIN PHOSPHATE 900 MG/50ML IV SOLN
INTRAVENOUS | Status: AC
  Administered 2019-04-30: 900 mg via INTRAVENOUS
  Filled 2019-04-30: qty 50

## 2019-04-30 MED ORDER — MIDAZOLAM HCL 2 MG/2ML IJ SOLN
INTRAMUSCULAR | Status: AC | PRN
  Administered 2019-04-30 (×6): 1 mg via INTRAVENOUS

## 2019-04-30 MED ORDER — KETOROLAC TROMETHAMINE 30 MG/ML IJ SOLN: 30.0000 mg | INTRAMUSCULAR | Status: DC

## 2019-04-30 MED ORDER — DIPHENHYDRAMINE HCL 50 MG/ML IJ SOLN: 12.5000 mg | Freq: Four times a day (QID) | INTRAMUSCULAR | Status: DC | PRN

## 2019-04-30 MED ORDER — INFLUENZA VAC SPLIT QUAD 0.5 ML IM SUSY
0.5000 mL | PREFILLED_SYRINGE | INTRAMUSCULAR | Status: AC
  Administered 2019-05-01: 0.5 mL via INTRAMUSCULAR
  Filled 2019-04-30: qty 0.5

## 2019-04-30 MED ORDER — KETOROLAC TROMETHAMINE 60 MG/2ML IM SOLN
60.0000 mg | Freq: Once | INTRAMUSCULAR | Status: AC
  Administered 2019-04-30: 60 mg via INTRAMUSCULAR
  Filled 2019-04-30: qty 2

## 2019-04-30 MED ORDER — MIDAZOLAM HCL 2 MG/2ML IJ SOLN: 1.0000 mg | Freq: Once | INTRAMUSCULAR | Status: AC

## 2019-04-30 MED ORDER — ALBUTEROL SULFATE (2.5 MG/3ML) 0.083% IN NEBU: 2.5000 mg | INHALATION_SOLUTION | Freq: Four times a day (QID) | RESPIRATORY_TRACT | Status: DC | PRN

## 2019-04-30 MED ORDER — ROPIVACAINE HCL 5 MG/ML IJ SOLN
INTRAMUSCULAR | Status: AC | PRN
  Administered 2019-04-30: 10 mL

## 2019-04-30 MED ORDER — ONDANSETRON HCL 4 MG/2ML IJ SOLN
4.0000 mg | Freq: Four times a day (QID) | INTRAMUSCULAR | Status: DC | PRN
  Administered 2019-05-01: 4 mg via INTRAVENOUS
  Filled 2019-04-30: qty 2

## 2019-04-30 MED ORDER — MIDAZOLAM HCL 2 MG/2ML IJ SOLN
INTRAMUSCULAR | Status: AC
  Filled 2019-04-30: qty 6

## 2019-04-30 MED ORDER — NALOXONE HCL 0.4 MG/ML IJ SOLN: 0.4000 mg | INTRAMUSCULAR | Status: DC | PRN

## 2019-04-30 MED ORDER — SODIUM CHLORIDE 0.9 % IV SOLN: INTRAVENOUS | Status: AC

## 2019-04-30 MED ORDER — CLINDAMYCIN PHOSPHATE 900 MG/50ML IV SOLN: 900.0000 mg | Freq: Once | INTRAVENOUS | Status: AC

## 2019-04-30 MED ORDER — DIPHENHYDRAMINE HCL 12.5 MG/5ML PO ELIX: 12.5000 mg | ORAL_SOLUTION | Freq: Four times a day (QID) | ORAL | Status: DC | PRN

## 2019-04-30 MED ORDER — ROPIVACAINE HCL 5 MG/ML IJ SOLN
INTRAMUSCULAR | Status: AC
  Filled 2019-04-30: qty 30

## 2019-04-30 MED ORDER — HYDROMORPHONE 1 MG/ML IV SOLN
INTRAVENOUS | Status: DC
  Administered 2019-04-30: 30 mg via INTRAVENOUS
  Administered 2019-04-30: 2.3 mg via INTRAVENOUS
  Administered 2019-05-01: 1.3 mg via INTRAVENOUS
  Administered 2019-05-01: 5 mg via INTRAVENOUS
  Administered 2019-05-01: 0.6 mg via INTRAVENOUS
  Filled 2019-04-30: qty 30

## 2019-04-30 MED ORDER — FENTANYL CITRATE (PF) 100 MCG/2ML IJ SOLN
INTRAMUSCULAR | Status: AC
  Filled 2019-04-30: qty 4

## 2019-04-30 MED ORDER — IOHEXOL 300 MG/ML  SOLN
100.0000 mL | Freq: Once | INTRAMUSCULAR | Status: AC | PRN
  Administered 2019-04-30: 38 mL via INTRA_ARTERIAL

## 2019-04-30 MED ORDER — SODIUM CHLORIDE 0.9 % IV SOLN
INTRAVENOUS | Status: DC
  Administered 2019-04-30 – 2019-05-01 (×2): via INTRAVENOUS

## 2019-04-30 MED ORDER — LIDOCAINE HCL 1 % IJ SOLN
INTRAMUSCULAR | Status: AC
  Filled 2019-04-30: qty 20

## 2019-04-30 MED ORDER — KETOROLAC TROMETHAMINE 30 MG/ML IJ SOLN
30.0000 mg | Freq: Four times a day (QID) | INTRAMUSCULAR | Status: DC
  Administered 2019-04-30 – 2019-05-01 (×5): 30 mg via INTRAVENOUS
  Filled 2019-04-30 (×5): qty 1

## 2019-04-30 MED ORDER — PROMETHAZINE HCL 25 MG PO TABS: 25.0000 mg | ORAL_TABLET | Freq: Three times a day (TID) | ORAL | Status: DC | PRN

## 2019-04-30 MED ORDER — SODIUM CHLORIDE 0.9% FLUSH: 9.0000 mL | INTRAVENOUS | Status: DC | PRN

## 2019-04-30 MED ORDER — PROMETHAZINE HCL 25 MG/ML IJ SOLN: 25.0000 mg | Freq: Four times a day (QID) | INTRAMUSCULAR | Status: AC | PRN

## 2019-04-30 MED ORDER — LORATADINE 10 MG PO TABS
10.0000 mg | ORAL_TABLET | Freq: Every day | ORAL | Status: DC
  Administered 2019-05-01: 10 mg via ORAL
  Filled 2019-04-30: qty 1

## 2019-04-30 MED ORDER — CALCIUM CARBONATE ANTACID 500 MG PO CHEW: 1.0000 | CHEWABLE_TABLET | Freq: Every day | ORAL | Status: DC | PRN

## 2019-04-30 MED ORDER — PROMETHAZINE HCL 25 MG RE SUPP
25.0000 mg | Freq: Three times a day (TID) | RECTAL | Status: DC | PRN
  Filled 2019-04-30: qty 1

## 2019-04-30 MED ORDER — ONDANSETRON HCL 4 MG/2ML IJ SOLN
4.0000 mg | Freq: Four times a day (QID) | INTRAMUSCULAR | Status: AC
  Administered 2019-04-30 – 2019-05-01 (×4): 4 mg via INTRAVENOUS
  Filled 2019-04-30 (×4): qty 2

## 2019-04-30 MED ORDER — IOHEXOL 300 MG/ML  SOLN
100.0000 mL | Freq: Once | INTRAMUSCULAR | Status: AC | PRN
  Administered 2019-04-30: 10 mL via INTRA_ARTERIAL

## 2019-04-30 MED ORDER — MONTELUKAST SODIUM 10 MG PO TABS
10.0000 mg | ORAL_TABLET | Freq: Every day | ORAL | Status: DC
  Administered 2019-04-30: 10 mg via ORAL
  Filled 2019-04-30: qty 1

## 2019-04-30 MED ORDER — FENTANYL CITRATE (PF) 100 MCG/2ML IJ SOLN
INTRAMUSCULAR | Status: AC | PRN
  Administered 2019-04-30 (×4): 50 ug via INTRAVENOUS

## 2019-04-30 MED ORDER — SERTRALINE HCL 25 MG PO TABS
25.0000 mg | ORAL_TABLET | Freq: Every day | ORAL | Status: DC
  Filled 2019-04-30: qty 1

## 2019-04-30 MED ORDER — DOCUSATE SODIUM 100 MG PO CAPS
100.0000 mg | ORAL_CAPSULE | Freq: Two times a day (BID) | ORAL | Status: DC
  Administered 2019-04-30 – 2019-05-01 (×3): 100 mg via ORAL
  Filled 2019-04-30 (×3): qty 1

## 2019-04-30 NOTE — Progress Notes (Signed)
IR.  Patient underwent an image-guided pelvic arteriogram with bilateral uterine artery embolization along with superior hypogastric nerve block this AM by Dr. Earleen Newport.  Went to assess patient bedside alongside Dr. Earleen Newport. Patient awake and alert laying in bed reading. Complains of pelvic "cramping". Denies N/V. Right groin incision soft without active bleeding or hematoma. Distal pulses 2+ bilaterally.  PCA and Toradol for pain control. Zofran for nausea, phenergan for breakthrough. Right leg straight until 1800 tonight. Plan for D/C in AM if stable. IR to follow.   Bea Graff Adeana Grilliot, PA-C 04/30/2019, 4:11 PM

## 2019-04-30 NOTE — Procedures (Addendum)
Interventional Radiology Procedure Note  Procedure: US guided right CFA access for pelvic angiogram and bilateral uterine artery embolization for symptomatic fibroids.   Superior hypogastric nerve block also performed.   Exoseal for CFA closure.  Complications: None  Recommendations:  - 23 hr observation - right hip straight x 6 hours - Q 6 hr zofran for nausea - prn promethazine IV for breakthrough nausea - Q6 hour toradol IV - on demand PCA - ice pack/heat pack prn for comfort  - advance diet - OK to remove the foley catheter tonight after 6pm if the patient prefers to walk to bathroom, otherwise remove at 6am tomorrow - saline infusion to continue for 6 hours @150cc /hr - frequent NV checks - Do not submerge for 7 days - Routine wound care - follow up with Dr. Earleen Newport in clinic 2-4 weeks   Signed,  Dulcy Fanny. Earleen Newport, DO

## 2019-04-30 NOTE — Progress Notes (Signed)
MD paged regarding patients home medications that were not ordered. Orders received for allegra, her birth control, and to d/c zoloft because per pt she is no longer taking. Pharmacy does not stock her birth control. Pt stated she would have her husband bring her birth control. RN told her that it would have to be handled and dispensed by pharmacy while she was here, pt was in agreement of this. Also, pharmacy does not stock allegra and would need to be changed to Claritin, pt is also in agreement of this.

## 2019-04-30 NOTE — H&P (Signed)
Chief Complaint: Patient was seen in consultation today for symptomatic uterine fibroids/uterine artery embolization.  Referring Physician(s): Servando Salina  Supervising Physician: Corrie Mckusick  Patient Status: [facility] - Out-pt  History of Present Illness: Lindsey Le is a 48 y.o. female with a past medical history of asthma, skin cancer, uterine fibroids, anxiety, and depression. She has had symptomatic uterine fibroids for years, with symptoms of worsening bleeding and pain. She was referred by Dr. Garwin Brothers to IR for management of symptomatic uterine fibroids. She consulted with Dr. Earleen Newport 03/19/2019 to discuss management options. At that time, patient decided to pursue uterine artery embolization.  Patient presents today for possible image-guided pelvic arteriogram with possible uterine artery embolization. Patient awake and alert laying in bed. Complains of "pelvic discomfort", rated 2/10 at this time. Denies fever, chills, chest pain, dyspnea, abdominal pain, or headache.   Past Medical History:  Diagnosis Date  . Anxiety   . Asthma   . Cancer ([place])    skin removed with laser surgery  . Depression   . Family history of adverse reaction to anesthesia    mother has had nausea after surgery    Past Surgical History:  Procedure Laterality Date  . DILITATION & CURRETTAGE/HYSTROSCOPY WITH NOVASURE ABLATION N/A 08/30/2017   Procedure: DILATATION & CURETTAGE/HYSTEROSCOPY WITH ENDOMETRIAL  ABLATION;  Surgeon: Servando Salina, MD;  Location: [facility];  Service: Gynecology;  Laterality: N/A;  . IR RADIOLOGIST EVAL & MGMT  03/19/2019  . WISDOM TOOTH EXTRACTION  2000    Allergies: Prednisone, Erythromycin, Grapeseed extract [nutritional supplements], Lactose intolerance (gi), Omnicef [cefdinir], Other, and Pollinex-t [modified tree tyrosine adsorbate]  Medications: Prior to Admission medications   Medication Sig Start Date End Date Taking? Authorizing Provider   albuterol (PROVENTIL HFA;VENTOLIN HFA) 108 (90 Base) MCG/ACT inhaler Inhale 2 puffs into the lungs every 6 (six) hours as needed for wheezing or shortness of breath.   Yes [provider]  ALPRAZolam Duanne Moron) 0.5 MG tablet Take 0.5 mg by mouth as needed for anxiety.    Yes [provider]  calcium carbonate (TUMS - DOSED IN MG ELEMENTAL CALCIUM) 500 MG chewable tablet Chew 1-2 tablets by mouth daily as needed for indigestion or heartburn.   Yes [provider]  fexofenadine (ALLEGRA) 180 MG tablet Take 180 mg by mouth daily.   Yes [provider]  lactase (LACTAID) 3000 units tablet Take 6,000 Units by mouth daily as needed (lactose intolerance).    Yes [provider]  levonorgestrel-ethinyl estradiol (AVIANE,ALESSE,LESSINA) 0.1-20 MG-MCG tablet Take 1 tablet by mouth at bedtime.    Yes [provider]  montelukast (SINGULAIR) 10 MG tablet Take 10 mg by mouth at bedtime.   Yes [provider]  Multiple Vitamin (MULTIVITAMIN WITH MINERALS) TABS tablet Take 1 tablet by mouth at bedtime.   Yes [provider]  sertraline (ZOLOFT) 25 MG tablet Take 25 mg by mouth at bedtime.    [provider]  Triamcinolone Acetonide (NASACORT ALLERGY 24HR NA) Place 1 spray into the nose daily as needed (allergies).    [provider]     History reviewed. No pertinent family history.  Social History   Socioeconomic History  . Marital status: Married    Spouse name: Not on file  . Number of children: Not on file  . Years of education: Not on file  . Highest education level: Not on file  Occupational History  . Not on file  Tobacco Use  . Smoking status: Former  Smoker    Packs/day: 0.25    Years: 1.00    Pack years: 0.25    Types: Cigarettes  . Smokeless tobacco: Never Used  Substance and Sexual Activity  . Alcohol use: Not on file    Comment: occasional  . Drug use: Never  . Sexual activity: Not on file  Other  Topics Concern  . Not on file  Social History Narrative  . Not on file   Social Determinants of Health   Financial Resource Strain:   . Difficulty of Paying Living Expenses: Not on file  Food Insecurity:   . Worried About Charity fundraiser in the Last Year: Not on file  . Ran Out of Food in the Last Year: Not on file  Transportation Needs:   . Lack of Transportation (Medical): Not on file  . Lack of Transportation (Non-Medical): Not on file  Physical Activity:   . Days of Exercise per Week: Not on file  . Minutes of Exercise per Session: Not on file  Stress:   . Feeling of Stress : Not on file  Social Connections:   . Frequency of Communication with Friends and Family: Not on file  . Frequency of Social Gatherings with Friends and Family: Not on file  . Attends Religious Services: Not on file  . Active Member of Clubs or Organizations: Not on file  . Attends Archivist Meetings: Not on file  . Marital Status: Not on file     Review of Systems: A 12 point ROS discussed and pertinent positives are indicated in the HPI above.  All other systems are negative.  Review of Systems  Constitutional: Negative for chills and fever.  Respiratory: Negative for shortness of breath and wheezing.   Cardiovascular: Negative for chest pain and palpitations.  Gastrointestinal: Negative for abdominal pain.  Genitourinary: Positive for pelvic pain.  Neurological: Negative for headaches.  Psychiatric/Behavioral: Negative for behavioral problems and confusion.    Vital Signs: BP 129/84   Pulse 86   Temp 98.4 F (36.9 C) (Oral)   Resp 15   Ht 5\' 4"  (1.626 m)   Wt 136 lb (61.7 kg)   LMP 04/05/2019 (Approximate)   SpO2 100%   BMI 23.34 kg/m   Physical Exam Vitals and nursing note reviewed.  Constitutional:      General: She is not in acute distress.    Appearance: Normal appearance.  Cardiovascular:     Rate and Rhythm: Normal rate and regular rhythm.     Heart sounds:  Normal heart sounds. No murmur.  Pulmonary:     Effort: Pulmonary effort is normal. No respiratory distress.     Breath sounds: Normal breath sounds. No wheezing.  Skin:    General: Skin is warm and dry.  Neurological:     Mental Status: She is alert and oriented to person, place, and time.  Psychiatric:        Mood and Affect: Mood normal.        Behavior: Behavior normal.      MD Evaluation Airway: WNL Heart: WNL Abdomen: WNL Chest/ Lungs: WNL ASA  Classification: 2 Mallampati/Airway Score: Two   Imaging: No results found.  Labs:  CBC: Recent Labs    04/30/19 0825  WBC 4.6  HGB 12.2  HCT 38.4  PLT 299    COAGS: Recent Labs    04/30/19 0825  INR 1.1    BMP: Recent Labs    04/30/19 0825  NA 139  K  4.8  CL 107  CO2 21*  GLUCOSE 90  BUN 15  CALCIUM 8.7*  CREATININE 0.75  GFRNONAA >60  GFRAA >60     Assessment and Plan:  Symptomatic uterine fibroids. Plan for image-guided pelvic arteriogram with possible uterine artery embolization, with possible superior hypogastric nerve block today in IR with Dr. Earleen Newport. Patient is NPO. Afebrile and WBCs WNL. She does not take blood thinners. INR 1.1 today. Serum hCG pending.  The risks and benefits of uterine artery embolization were discussed with the patient including, but not limited to bleeding, infection, vascular injury, post operative pain, or contrast induced renal failure. This procedure involves the use of X-rays and because of the nature of the planned procedure, it is possible that we will have prolonged use of X-ray fluoroscopy. Potential radiation risks to you include (but are not limited to) the following: - A slightly elevated risk for cancer several years later in life. This risk is typically less than 0.5% percent. This risk is low in comparison to the normal incidence of human cancer, which is 33% for women and 50% for men according to the [place]. - Radiation induced  injury can include skin redness, resembling a rash, tissue breakdown / ulcers and hair loss (which can be temporary or permanent).  The likelihood of either of these occurring depends on the difficulty of the procedure and whether you are sensitive to radiation due to previous procedures, disease, or genetic conditions.  IF your procedure requires a prolonged use of radiation, you will be notified and given written instructions for further action.  It is your responsibility to monitor the irradiated area for the 2 weeks following the procedure and to notify your physician if you are concerned that you have suffered a radiation induced injury.  All of the patient's questions were answered, patient is agreeable to proceed. Consent signed and in chart.   Thank you for this interesting consult.  I greatly enjoyed meeting MCKAY NOTZ and look forward to participating in their care.  A copy of this report was sent to the requesting provider on this date.  Electronically Signed: Earley Abide, PA-C 04/30/2019, 9:07 AM   I spent a total of 40 Minutes in face to face in clinical consultation, greater than 50% of which was counseling/coordinating care for symptomatic uterine fibroids/uterine artery embolization.

## 2019-05-01 DIAGNOSIS — D259 Leiomyoma of uterus, unspecified: Secondary | ICD-10-CM | POA: Diagnosis not present

## 2019-05-01 MED ORDER — ONDANSETRON HCL 8 MG PO TABS: 8.0000 mg | ORAL_TABLET | Freq: Two times a day (BID) | ORAL | 0 refills | Status: AC

## 2019-05-01 MED ORDER — HYDROCODONE-ACETAMINOPHEN 5-325 MG PO TABS
1.0000 | ORAL_TABLET | ORAL | Status: DC | PRN
  Administered 2019-05-01: 1 via ORAL
  Filled 2019-05-01: qty 1

## 2019-05-01 MED ORDER — HYDROCODONE-ACETAMINOPHEN 5-325 MG PO TABS: 1.0000 | ORAL_TABLET | ORAL | 0 refills | Status: AC | PRN

## 2019-05-01 NOTE — Discharge Summary (Signed)
Patient ID: Lindsey Le MRN: [id number] DOB/AGE: 1971-02-03 48 y.o.  Admit date: 04/30/2019 Discharge date: 05/01/2019  Supervising Physician: Lindsey Le  Patient Status: [facility] - In-pt  Admission Diagnoses: Symptomatic uterine fibroids  Discharge Diagnoses: Symptomatic uterine fibroids, status bilateral uterine artery embolization and fluoroscopic guided superior hypogastric nerve block on 04/30/2019 Active Problems:   Fibroids  Past Medical History:  Diagnosis Date  . Anxiety   . Asthma   . Cancer ([place])    skin removed with laser surgery  . Depression   . Family history of adverse reaction to anesthesia    mother has had nausea after surgery   Past Surgical History:  Procedure Laterality Date  . DILITATION & CURRETTAGE/HYSTROSCOPY WITH NOVASURE ABLATION N/A 08/30/2017   Procedure: DILATATION & CURETTAGE/HYSTEROSCOPY WITH ENDOMETRIAL  ABLATION;  Surgeon: Lindsey Salina, MD;  Location: [facility];  Service: Gynecology;  Laterality: N/A;  . IR ANGIOGRAM PELVIS SELECTIVE OR SUPRASELECTIVE  04/30/2019  . IR ANGIOGRAM PELVIS SELECTIVE OR SUPRASELECTIVE  04/30/2019  . IR ANGIOGRAM SELECTIVE EACH ADDITIONAL VESSEL  04/30/2019  . IR ANGIOGRAM SELECTIVE EACH ADDITIONAL VESSEL  04/30/2019  . IR EMBO TUMOR ORGAN ISCHEMIA INFARCT INC GUIDE ROADMAPPING  04/30/2019  . IR FLUORO GUIDED NEEDLE PLC ASPIRATION/INJECTION LOC  04/30/2019  . IR RADIOLOGIST EVAL & MGMT  03/19/2019  . IR US GUIDE VASC ACCESS RIGHT  04/30/2019  . WISDOM TOOTH EXTRACTION  2000     Discharged Condition: good  Hospital Course: Lindsey Le is a 48 year old female with history of symptomatic uterine fibroids who underwent consultation with Dr. Earleen Le on 03/19/2019 to discuss treatment options.  She was deemed an appropriate candidate for bilateral uterine artery embolization and underwent the procedure at [facility] on 04/30/2019.  Also performed at that time was a fluoroscopic guided superior  hypogastric nerve block.  She tolerated the procedure well and was subsequently admitted to the hospital for overnight observation for pain control.  She was placed on Dilaudid PCA pump.  Overnight she did well with exception of some expected mild pelvic cramping.  On the day of discharge she was stable.  She continued to have some minimal pelvic cramping but denied fever, headache, chest pain, dyspnea, cough, nausea, vomiting or bleeding.  She was able to ambulate, void and tolerate her diet without significant difficulty.  She was deemed stable for discharge at this time.  She may resume her usual home medications.  Prescriptions for Norco/Vicodin and Zofran were electronically sent to patient's pharmacy.  IR service will follow up with patient either via phone or in clinic in 2 to 4 weeks. She will continue current gynecologic care with Dr. Garwin Le.  She was told to contact our service with any additional questions.  Consults: none  Significant Diagnostic Studies:  Results for orders placed or performed during the hospital encounter of [id number]  Basic metabolic panel  Result Value Ref Range   Sodium 139 135 - 145 mmol/L   Potassium 4.8 3.5 - 5.1 mmol/L   Chloride 107 98 - 111 mmol/L   CO2 21 (L) 22 - 32 mmol/L   Glucose, Bld 90 70 - 99 mg/dL   BUN 15 6 - 20 mg/dL   Creatinine, Ser 0.75 0.44 - 1.00 mg/dL   Calcium 8.7 (L) 8.9 - 10.3 mg/dL   GFR calc non Af Amer >60 >60 mL/min   GFR calc Af Amer >60 >60 mL/min   Anion gap 11 5 - 15  CBC with Differential  Result Value  Ref Range   WBC 4.6 4.0 - 10.5 K/uL   RBC 4.11 3.87 - 5.11 MIL/uL   Hemoglobin 12.2 12.0 - 15.0 g/dL   HCT 38.4 36.0 - 46.0 %   MCV 93.4 80.0 - 100.0 fL   MCH 29.7 26.0 - 34.0 pg   MCHC 31.8 30.0 - 36.0 g/dL   RDW 12.9 11.5 - 15.5 %   Platelets 299 150 - 400 K/uL   nRBC 0.0 0.0 - 0.2 %   Neutrophils Relative % 50 %   Neutro Abs 2.3 1.7 - 7.7 K/uL   Lymphocytes Relative 39 %   Lymphs Abs 1.8 0.7 - 4.0 K/uL   Monocytes  Relative 7 %   Monocytes Absolute 0.3 0.1 - 1.0 K/uL   Eosinophils Relative 3 %   Eosinophils Absolute 0.1 0.0 - 0.5 K/uL   Basophils Relative 1 %   Basophils Absolute 0.0 0.0 - 0.1 K/uL   Immature Granulocytes 0 %   Abs Immature Granulocytes 0.00 0.00 - 0.07 K/uL  hCG, serum, qualitative  Result Value Ref Range   Preg, Serum NEGATIVE NEGATIVE  Protime-INR  Result Value Ref Range   Prothrombin Time 13.6 11.4 - 15.2 seconds   INR 1.1 0.8 - 1.2  Type and screen  Result Value Ref Range   ABO/RH(D) A POS    Antibody Screen NEG    Sample Expiration      05/03/2019,2359 Performed at [facility], [place] [address] [place], [place] [address]   ABO/Rh  Result Value Ref Range   ABO/RH(D)      A POS Performed at [facility], [place] [address], [place], [place] [address]      Treatments: Bilateral uterine artery embolization with superior hypogastric nerve block on 04/30/2019 via IV conscious sedation  Discharge Exam: Blood pressure 125/76, pulse 70, temperature 98.1 F (36.7 C), temperature source Oral, resp. rate 16, height 5\' 4"  (1.626 m), weight 136 lb (61.7 kg), last menstrual period 04/05/2019, SpO2 98 %. Awake, alert.  Chest clear to auscultation bilaterally.  Heart with regular rate and rhythm.  Abdomen soft, positive bowel sounds, some mild lower abdominal tenderness to palpation.  Puncture sites suprapubic region and right common femoral artery clean, dry, soft, no hematomas.  No lower extremity edema, intact distal pulses.  Disposition: Discharge disposition: 01-Home or Self Care       Discharge Instructions    Call MD for:  difficulty breathing, headache or visual disturbances   Complete by: As directed    Call MD for:  extreme fatigue   Complete by: As directed    Call MD for:  hives   Complete by: As directed    Call MD for:  persistant dizziness or light-headedness   Complete by: As directed    Call MD for:  persistant  nausea and vomiting   Complete by: As directed    Call MD for:  redness, tenderness, or signs of infection (pain, swelling, redness, odor or green/yellow discharge around incision site)   Complete by: As directed    Call MD for:  severe uncontrolled pain   Complete by: As directed    Call MD for:  temperature >100.4   Complete by: As directed    Change dressing (specify)   Complete by: As directed    May remove bandage from right groin and apply Band-Aid to site for the next 2 to 3 days.  May wash site with soap and water.   Diet -  low sodium heart healthy   Complete by: As directed    Discharge instructions   Complete by: As directed    May resume home medications; do not take Tylenol and Vicodin together; avoid driving after taking Vicodin; stay well-hydrated   Driving Restrictions   Complete by: As directed    No driving for 24 hours   Increase activity slowly   Complete by: As directed    Lifting restrictions   Complete by: As directed    No heavy lifting for 3 to 4 days   May shower / Bathe   Complete by: As directed    May walk up steps   Complete by: As directed    Sexual Activity Restrictions   Complete by: As directed    No sexual intercourse for 1 week     Allergies as of 05/01/2019      Reactions   Prednisone Other (See Comments)   Hallucinations, blurred vision    Erythromycin Nausea And Vomiting   Grapeseed Extract [nutritional Supplements] Hives   Lactose Intolerance (gi) Diarrhea   Gas, upset GI   Omnicef [cefdinir] Hives   Itchy bones   Other Hives   Grapes - fruit, stomach cramps    Pollinex-t [modified Tree Tyrosine Adsorbate]       Medication List    TAKE these medications   acetaminophen 325 MG tablet Commonly known as: TYLENOL Take 650 mg by mouth every 6 (six) hours as needed for mild pain or headache.   albuterol 108 (90 Base) MCG/ACT inhaler Commonly known as: VENTOLIN HFA Inhale 2 puffs into the lungs every 6 (six) hours as needed for  wheezing or shortness of breath.   ALPRAZolam 0.5 MG tablet Commonly known as: XANAX Take 0.5 mg by mouth daily as needed for anxiety.   calcium carbonate 500 MG chewable tablet Commonly known as: TUMS - dosed in mg elemental calcium Chew 1-2 tablets by mouth daily as needed for indigestion or heartburn.   CALCIUM PO Take 1 tablet by mouth daily.   fexofenadine 180 MG tablet Commonly known as: ALLEGRA Take 180 mg by mouth daily.   HYDROcodone-acetaminophen 5-325 MG tablet Commonly known as: NORCO/VICODIN Take 1-2 tablets by mouth every 4 (four) hours as needed for moderate pain.   ibuprofen 200 MG tablet Commonly known as: ADVIL Take 600 mg by mouth every 6 (six) hours as needed for fever, headache or moderate pain.   lactase 3000 units tablet Commonly known as: LACTAID Take 6,000 Units by mouth See admin instructions. Daily with meals that include dairy   levonorgestrel-ethinyl estradiol 0.1-20 MG-MCG tablet Commonly known as: ALESSE Take 1 tablet by mouth at bedtime.   Melatonin 5 MG Tabs Take 5 mg by mouth at bedtime as needed (SLEEP).   montelukast 10 MG tablet Commonly known as: SINGULAIR Take 10 mg by mouth at bedtime.   multivitamin with minerals Tabs tablet Take 1 tablet by mouth at bedtime.   NASACORT ALLERGY 24HR NA Place 1 spray into the nose daily as needed (allergies).   ondansetron 8 MG tablet Commonly known as: Zofran Take 1 tablet (8 mg total) by mouth 2 (two) times daily.            Discharge Care Instructions  (From admission, onward)         Start     Ordered   05/01/19 0000  Change dressing (specify)    Comments: May remove bandage from right groin and apply Band-Aid to site for the  next 2 to 3 days.  May wash site with soap and water.   05/01/19 1424         Follow-up Information    Lindsey Mckusick, DO Follow up.   Specialties: Interventional Radiology, Radiology Why: Radiology will either call you or have you come into IR  clinic in 2 to 3 weeks for follow-up; call [phone] or [phone] with any questions Contact information: [place] [address] [phone]        Lindsey Salina, MD Follow up.   Specialty: Obstetrics and Gynecology Why: Continue follow-up with Dr. Garwin Le as scheduled Contact information: [address] [place] [address] [phone]            Electronically Signed: D. Rowe Robert, PA-C 05/01/2019, 2:26 PM   I have spent Less Than 30 Minutes discharging Lindsey Le.

## 2019-05-01 NOTE — Plan of Care (Signed)

## 2019-05-01 NOTE — Discharge Instructions (Signed)
Uterine Artery Embolization for Fibroids, Care After This sheet gives you information about how to care for yourself after your procedure. Your health care provider may also give you more specific instructions. If you have problems or questions, contact your health care provider. What can I expect after the procedure? After your procedure, it is common to have:  Pelvic cramping. You will be given pain medicine.  Nausea and vomiting. You may be given medicine to help relieve nausea. Follow these instructions at home: Incision care  Follow instructions from your health care provider about how to take care of your incision. Make sure you: ? Wash your hands with soap and water before you change your bandage (dressing). If soap and water are not available, use hand sanitizer. ? Change your dressing as told by your health care provider.  Check your incision area every day for signs of infection. Check for: ? More redness, swelling, or pain. ? More fluid or blood. ? Warmth. ? Pus or a bad smell. Medicines   Take over-the-counter and prescription medicines only as told by your health care provider.  Do not take aspirin. It can cause bleeding.  Do not drive for 24 hours if you were given a medicine to help you relax (sedative).  Do not drive or use heavy machinery while taking prescription pain medicine. General instructions  Ask your health care provider when you can resume sexual activity.  To prevent or treat constipation while you are taking prescription pain medicine, your health care provider may recommend that you: ? Drink enough fluid to keep your urine clear or pale yellow. ? Take over-the-counter or prescription medicines. ? Eat foods that are high in fiber, such as fresh fruits and vegetables, whole grains, and beans. ? Limit foods that are high in fat and processed sugars, such as fried and sweet foods. Contact a health care provider if:  You have a fever.  You have more  redness, swelling, or pain around your incision site.  You have more fluid or blood coming from your incision site.  Your incision feels warm to the touch.  You have pus or a bad smell coming from your incision.  You have a rash.  You have uncontrolled nausea or you cannot eat or drink anything without vomiting. Get help right away if:  You have trouble breathing.  You have chest pain.  You have severe abdominal pain.  You have leg pain.  You become dizzy and faint. Summary  After your procedure, it is common to have pelvic cramping. You will be given pain medicine.  Follow instructions from your health care provider about how to take care of your incision.  Check your incision area every day for signs of infection.  Take over-the-counter and prescription medicines only as told by your health care provider. This information is not intended to replace advice given to you by your health care provider. Make sure you discuss any questions you have with your health care provider. Document Released: 02/25/2013 Document Revised: 04/19/2017 Document Reviewed: 08/09/2016 Elsevier Patient Education  2020 [employer].

## 2019-05-01 NOTE — Progress Notes (Signed)
Dilaudid PCA discontinued at 1200pm with Kathlynn Grate. 7mL/21 mg wasted in pyxis and stericycle.

## 2019-06-09 ENCOUNTER — Ambulatory Visit
Admission: RE | Admit: 2019-06-09 | Discharge: 2019-06-09 | Disposition: A | Discharge status: Home / Self Care | Payer: BC Managed Care – PPO | Source: Ambulatory Visit | Attending: Radiology | Admitting: Radiology

## 2019-06-09 ENCOUNTER — Other Ambulatory Visit: Payer: Self-pay

## 2019-06-09 ENCOUNTER — Encounter: Payer: Self-pay | Admitting: *Deleted

## 2019-06-09 DIAGNOSIS — D259 Leiomyoma of uterus, unspecified: Secondary | ICD-10-CM

## 2019-06-09 HISTORY — PX: IR RADIOLOGIST EVAL & MGMT: IMG5224

## 2019-06-09 NOTE — Progress Notes (Signed)
Chief Complaint: Symptomatic Uterine Fibroids, SP [place]  Referring Physician(s): Dr. Servando Salina  History of Present Illness: Lindsey Le is a 49 y.o. female presenting today to [place] clinic as a scheduled follow up appointment SP pelvic angiogram and treatment of symptomatic uterine fibroids with bilateral uterine artery embolization and superior hypogastric nerve block.   She joins [place] today by telemedicine, given our COVID situation.  I confirmed her identity with 2 identifiers.   She was treated 04/30/2019 for her symptomatic fibroids at [facility].  We performed an access of right CFA, pelvic angiogram, superior hypogastric nerve block, and bilateral uterine artery embolization.  She recovered overnight in the hospital and was discharged on 05/01/2019.    She tells me that when she first got home she had moderate pelvic discomfort that was treated well by her prescription medications, which included ibuprofen and vicodin.  She then became constipated by the vicodin medication, which she tells me was her worst complaint.  After stopping the medication and after constipation resolved, she has no further complains.  She occasionally has a twinge of pelvic pain, but it is improved from previous.   She states that she has had 2 cycles since the treatment, with decreased volume of bleeding during both.  The time was about 5 days each, with mild-to-moderate flow, which is improved.  Her spotting has improved.    She denies any fevers, any foul or concerning drainage.  She denies any large clots or tissue at this point.    She has returned to both physical and sexual activity without discomfort.   She has no concern for the right CFA puncture site, with no bulge, bruising, or pain.   She is very satisfied with the early results.    Past Medical History:  Diagnosis Date  . Anxiety   . Asthma   . Cancer ([place])    skin removed with laser surgery  . Depression   . Family history of  adverse reaction to anesthesia    mother has had nausea after surgery    Past Surgical History:  Procedure Laterality Date  . DILITATION & CURRETTAGE/HYSTROSCOPY WITH NOVASURE ABLATION N/A 08/30/2017   Procedure: DILATATION & CURETTAGE/HYSTEROSCOPY WITH ENDOMETRIAL  ABLATION;  Surgeon: Servando Salina, MD;  Location: [facility];  Service: Gynecology;  Laterality: N/A;  . IR ANGIOGRAM PELVIS SELECTIVE OR SUPRASELECTIVE  04/30/2019  . IR ANGIOGRAM PELVIS SELECTIVE OR SUPRASELECTIVE  04/30/2019  . IR ANGIOGRAM SELECTIVE EACH ADDITIONAL VESSEL  04/30/2019  . IR ANGIOGRAM SELECTIVE EACH ADDITIONAL VESSEL  04/30/2019  . IR EMBO TUMOR ORGAN ISCHEMIA INFARCT INC GUIDE ROADMAPPING  04/30/2019  . IR FLUORO GUIDED NEEDLE PLC ASPIRATION/INJECTION LOC  04/30/2019  . IR RADIOLOGIST EVAL & MGMT  03/19/2019  . IR RADIOLOGIST EVAL & MGMT  06/09/2019  . IR US GUIDE VASC ACCESS RIGHT  04/30/2019  . WISDOM TOOTH EXTRACTION  2000    Allergies: Prednisone, Erythromycin, Grapeseed extract [nutritional supplements], Lactose intolerance (gi), Omnicef [cefdinir], Other, and Pollinex-t [modified tree tyrosine adsorbate]  Medications: Prior to Admission medications   Medication Sig Start Date End Date Taking? Authorizing Provider  acetaminophen (TYLENOL) 325 MG tablet Take 650 mg by mouth every 6 (six) hours as needed for mild pain or headache.    [provider]  albuterol (PROVENTIL HFA;VENTOLIN HFA) 108 (90 Base) MCG/ACT inhaler Inhale 2 puffs into the lungs every 6 (six) hours as needed for wheezing or shortness of breath.    [provider]  ALPRAZolam (  XANAX) 0.5 MG tablet Take 0.5 mg by mouth daily as needed for anxiety.     [provider]  calcium carbonate (TUMS - DOSED IN MG ELEMENTAL CALCIUM) 500 MG chewable tablet Chew 1-2 tablets by mouth daily as needed for indigestion or heartburn.    [provider]  CALCIUM PO Take 1 tablet by mouth daily.    [provider]  fexofenadine (ALLEGRA) 180 MG tablet Take 180 mg by mouth daily.    [provider]  HYDROcodone-acetaminophen (NORCO/VICODIN) 5-325 MG tablet Take 1-2 tablets by mouth every 4 (four) hours as needed for moderate pain. 05/01/19   Allred, Darrell K, PA-C  ibuprofen (ADVIL) 200 MG tablet Take 600 mg by mouth every 6 (six) hours as needed for fever, headache or moderate pain.    [provider]  lactase (LACTAID) 3000 units tablet Take 6,000 Units by mouth See admin instructions. Daily with meals that include dairy    [provider]  levonorgestrel-ethinyl estradiol (AVIANE,ALESSE,LESSINA) 0.1-20 MG-MCG tablet Take 1 tablet by mouth at bedtime.     [provider]  Melatonin 5 MG TABS Take 5 mg by mouth at bedtime as needed (SLEEP).    [provider]  montelukast (SINGULAIR) 10 MG tablet Take 10 mg by mouth at bedtime.    [provider]  Multiple Vitamin (MULTIVITAMIN WITH MINERALS) TABS tablet Take 1 tablet by mouth at bedtime.    [provider]  ondansetron (ZOFRAN) 8 MG tablet Take 1 tablet (8 mg total) by mouth 2 (two) times daily. 05/01/19   Allred, Darrell K, PA-C  Triamcinolone Acetonide (NASACORT ALLERGY 24HR NA) Place 1 spray into the nose daily as needed (allergies).    [provider]     No family history on file.  Social History   Socioeconomic History  . Marital status: Married    Spouse name: Not on file  . Number of children: Not on file  . Years of education: Not on file  . Highest education level: Not on file  Occupational History  . Not on file  Tobacco Use  . Smoking status: Former Smoker    Packs/day: 0.25    Years: 1.00    Pack years: 0.25    Types: Cigarettes  . Smokeless tobacco: Never Used  Substance and Sexual Activity  . Alcohol use: Not on file    Comment: occasional  . Drug use: Never  . Sexual activity: Not on file  Other Topics Concern  . Not on file  Social  History Narrative  . Not on file   Social Determinants of Health   Financial Resource Strain:   . Difficulty of Paying Living Expenses: Not on file  Food Insecurity:   . Worried About Charity fundraiser in the Last Year: Not on file  . Ran Out of Food in the Last Year: Not on file  Transportation Needs:   . Lack of Transportation (Medical): Not on file  . Lack of Transportation (Non-Medical): Not on file  Physical Activity:   . Days of Exercise per Week: Not on file  . Minutes of Exercise per Session: Not on file  Stress:   . Feeling of Stress : Not on file  Social Connections:   . Frequency of Communication with Friends and Family: Not on file  . Frequency of Social Gatherings with Friends and Family: Not on file  . Attends Religious Services: Not on file  . Active Member of Clubs or  Organizations: Not on file  . Attends Archivist Meetings: Not on file  . Marital Status: Not on file       Review of Systems  Review of Systems: A 12 point ROS discussed and pertinent positives are indicated in the HPI above.  All other systems are negative.  Physical Exam No direct physical exam was performed (except for noted visual exam findings with Video Visits).    Vital Signs: There were no vitals taken for this visit.  Imaging: IR Radiologist Eval & Mgmt  Result Date: 06/09/2019 Please refer to notes tab for details about interventional procedure. (Op Note)   Labs:  CBC: Recent Labs    04/30/19 0825  WBC 4.6  HGB 12.2  HCT 38.4  PLT 299    COAGS: Recent Labs    04/30/19 0825  INR 1.1    BMP: Recent Labs    04/30/19 0825  NA 139  K 4.8  CL 107  CO2 21*  GLUCOSE 90  BUN 15  CALCIUM 8.7*  CREATININE 0.75  GFRNONAA >60  GFRAA >60    LIVER FUNCTION TESTS: No results for input(s): BILITOT, AST, ALT, ALKPHOS, PROT, ALBUMIN in the last 8760 hours.  TUMOR MARKERS: No results for input(s): AFPTM, CEA, CA199, CHROMGRNA in the last 8760  hours.  Assessment and Plan:  49 yo female with history of symptomatic uterine fibroids, SP treatment with pelvic angio and bilateral uterine artery embolization, with superior hypogastric nerve block.    She is very satisfied with her early results, with no current complaints.    We discussed again the typical expectations in the first few months after treatment, including the possibility of passing any tissue from involuting fibroids.  She is aware this is possible.    We also discussed our expected follow up plan with the next visit in about 6 months.   Plan: - Follow up with [facility] in 6 months    Electronically Signed: Corrie Mckusick 06/09/2019, 1:58 PM   I spent a total of    25 Minutes in remote  clinical consultation, greater than 50% of which was counseling/coordinating care for [place].    Visit type: Audio and video (Webex).   Alternative for in-person consultation at [facility], [place] [address], [place], [place]. This visit type was conducted due to national recommendations for restrictions regarding the COVID-19 Pandemic (e.g. social distancing).  This format is felt to be most appropriate for this patient at this time.  All issues noted in this document were discussed and addressed.

## 2019-11-18 ENCOUNTER — Other Ambulatory Visit: Payer: Self-pay | Admitting: Interventional Radiology

## 2019-11-18 DIAGNOSIS — D250 Submucous leiomyoma of uterus: Secondary | ICD-10-CM

## 2019-12-03 ENCOUNTER — Other Ambulatory Visit: Payer: Self-pay

## 2019-12-03 ENCOUNTER — Ambulatory Visit
Admission: RE | Admit: 2019-12-03 | Discharge: 2019-12-03 | Disposition: A | Discharge status: Home / Self Care | Payer: 59 | Source: Ambulatory Visit | Attending: Interventional Radiology | Admitting: Interventional Radiology

## 2019-12-03 ENCOUNTER — Encounter: Payer: Self-pay | Admitting: *Deleted

## 2019-12-03 DIAGNOSIS — D250 Submucous leiomyoma of uterus: Secondary | ICD-10-CM

## 2019-12-03 HISTORY — PX: IR RADIOLOGIST EVAL & MGMT: IMG5224

## 2019-12-03 NOTE — Progress Notes (Signed)
Chief Complaint: Symptomatic Uterine Fibroids, SP treatment with [place]  Referring Physician(s): Dr. Servando Salina  History of Present Illness: Lindsey Le is a 49 y.o. female presenting today to [facility] as a scheduled follow up appointment SP pelvic angiogram and treatment of symptomatic uterine fibroids with bilateral uterine artery embolization and superior hypogastric nerve block.   She joins [place] today by telemedicine, given our COVID situation.  I confirmed her identity with 2 identifiers.   She was treated 04/30/2019 for her symptomatic fibroids at [facility].  We performed an access of right CFA, pelvic angiogram, superior hypogastric nerve block, and bilateral uterine artery embolization.  She recovered overnight in the hospital and was discharged on 05/01/2019.    Today she tells me she is very pleased with her outcome.  She says, "I can wear white pants again."   She tells me that her periods have become more regular, predictably occurring between 2-3 weeks, with light flow for about 5-6 days.  She tells me that the "heaviest" day is a single day, and it is much less than previously.   She has also noticed that her pelvic cramping has improved, and she is very comfortable.    She has no concerns.   Past Medical History:  Diagnosis Date  . Anxiety   . Asthma   . Cancer ([place])    skin removed with laser surgery  . Depression   . Family history of adverse reaction to anesthesia    mother has had nausea after surgery    Past Surgical History:  Procedure Laterality Date  . DILITATION & CURRETTAGE/HYSTROSCOPY WITH NOVASURE ABLATION N/A 08/30/2017   Procedure: DILATATION & CURETTAGE/HYSTEROSCOPY WITH ENDOMETRIAL  ABLATION;  Surgeon: Servando Salina, MD;  Location: [facility];  Service: Gynecology;  Laterality: N/A;  . IR ANGIOGRAM PELVIS SELECTIVE OR SUPRASELECTIVE  04/30/2019  . IR ANGIOGRAM PELVIS SELECTIVE OR SUPRASELECTIVE  04/30/2019  . IR ANGIOGRAM SELECTIVE  EACH ADDITIONAL VESSEL  04/30/2019  . IR ANGIOGRAM SELECTIVE EACH ADDITIONAL VESSEL  04/30/2019  . IR EMBO TUMOR ORGAN ISCHEMIA INFARCT INC GUIDE ROADMAPPING  04/30/2019  . IR FLUORO GUIDED NEEDLE PLC ASPIRATION/INJECTION LOC  04/30/2019  . IR RADIOLOGIST EVAL & MGMT  03/19/2019  . IR RADIOLOGIST EVAL & MGMT  06/09/2019  . IR RADIOLOGIST EVAL & MGMT  12/03/2019  . IR US GUIDE VASC ACCESS RIGHT  04/30/2019  . WISDOM TOOTH EXTRACTION  2000    Allergies: Prednisone, Erythromycin, Grapeseed extract [nutritional supplements], Lactose intolerance (gi), Omnicef [cefdinir], Other, and Pollinex-t [modified tree tyrosine adsorbate]  Medications: Prior to Admission medications   Medication Sig Start Date End Date Taking? Authorizing Provider  acetaminophen (TYLENOL) 325 MG tablet Take 650 mg by mouth every 6 (six) hours as needed for mild pain or headache.    [provider]  albuterol (PROVENTIL HFA;VENTOLIN HFA) 108 (90 Base) MCG/ACT inhaler Inhale 2 puffs into the lungs every 6 (six) hours as needed for wheezing or shortness of breath.    [provider]  ALPRAZolam Duanne Moron) 0.5 MG tablet Take 0.5 mg by mouth daily as needed for anxiety.     [provider]  calcium carbonate (TUMS - DOSED IN MG ELEMENTAL CALCIUM) 500 MG chewable tablet Chew 1-2 tablets by mouth daily as needed for indigestion or heartburn.    [provider]  CALCIUM PO Take 1 tablet by mouth daily.    [provider]  fexofenadine (ALLEGRA) 180 MG tablet Take 180 mg by mouth daily.  [provider]  HYDROcodone-acetaminophen (NORCO/VICODIN) 5-325 MG tablet Take 1-2 tablets by mouth every 4 (four) hours as needed for moderate pain. 05/01/19   Allred, Darrell K, PA-C  ibuprofen (ADVIL) 200 MG tablet Take 600 mg by mouth every 6 (six) hours as needed for fever, headache or moderate pain.    [provider]  lactase (LACTAID) 3000 units tablet Take 6,000 Units by mouth See  admin instructions. Daily with meals that include dairy    [provider]  levonorgestrel-ethinyl estradiol (AVIANE,ALESSE,LESSINA) 0.1-20 MG-MCG tablet Take 1 tablet by mouth at bedtime.     [provider]  Melatonin 5 MG TABS Take 5 mg by mouth at bedtime as needed (SLEEP).    [provider]  montelukast (SINGULAIR) 10 MG tablet Take 10 mg by mouth at bedtime.    [provider]  Multiple Vitamin (MULTIVITAMIN WITH MINERALS) TABS tablet Take 1 tablet by mouth at bedtime.    [provider]  ondansetron (ZOFRAN) 8 MG tablet Take 1 tablet (8 mg total) by mouth 2 (two) times daily. 05/01/19   Allred, Darrell K, PA-C  Triamcinolone Acetonide (NASACORT ALLERGY 24HR NA) Place 1 spray into the nose daily as needed (allergies).    [provider]     No family history on file.  Social History   Socioeconomic History  . Marital status: Married    Spouse name: Not on file  . Number of children: Not on file  . Years of education: Not on file  . Highest education level: Not on file  Occupational History  . Not on file  Tobacco Use  . Smoking status: Former Smoker    Packs/day: 0.25    Years: 1.00    Pack years: 0.25    Types: Cigarettes  . Smokeless tobacco: Never Used  Vaping Use  . Vaping Use: Never used  Substance and Sexual Activity  . Alcohol use: Not on file    Comment: occasional  . Drug use: Never  . Sexual activity: Not on file  Other Topics Concern  . Not on file  Social History Narrative  . Not on file   Social Determinants of Health   Financial Resource Strain:   . Difficulty of Paying Living Expenses:   Food Insecurity:   . Worried About Charity fundraiser in the Last Year:   . Arboriculturist in the Last Year:   Transportation Needs:   . Film/video editor (Medical):   Marland Kitchen Lack of Transportation (Non-Medical):   Physical Activity:   . Days of Exercise per Week:   . Minutes of Exercise per Session:     Stress:   . Feeling of Stress :   Social Connections:   . Frequency of Communication with Friends and Family:   . Frequency of Social Gatherings with Friends and Family:   . Attends Religious Services:   . Active Member of Clubs or Organizations:   . Attends Archivist Meetings:   Marland Kitchen Marital Status:       Review of Systems  Review of Systems: A 12 point ROS discussed and pertinent positives are indicated in the HPI above.  All other systems are negative.  Physical Exam No direct physical exam was performed (except for noted visual exam findings with Video Visits).    Vital Signs: There were no vitals taken for this visit.  Imaging: IR Radiologist Eval & Mgmt  Result Date: 12/03/2019 Please refer to notes tab for  details about interventional procedure. (Op Note)   Labs:  CBC: Recent Labs    04/30/19 0825  WBC 4.6  HGB 12.2  HCT 38.4  PLT 299    COAGS: Recent Labs    04/30/19 0825  INR 1.1    BMP: Recent Labs    04/30/19 0825  NA 139  K 4.8  CL 107  CO2 21*  GLUCOSE 90  BUN 15  CALCIUM 8.7*  CREATININE 0.75  GFRNONAA >60  GFRAA >60    LIVER FUNCTION TESTS: No results for input(s): BILITOT, AST, ALT, ALKPHOS, PROT, ALBUMIN in the last 8760 hours.  TUMOR MARKERS: No results for input(s): AFPTM, CEA, CA199, CHROMGRNA in the last 8760 hours.  Assessment and Plan:  Ms Coomer is 49 yo female SP treatment of symptomatic uterine fibroids with [place], 04/30/2019.    She is very satisfied with her result, and has no concerns.    I discussed with her the expectation that she might see further improvement in her decreasing flow from now (post 6 months) to 12 months post-op.   She understands.   I did encourage her to reach out to our office at any point should she have any concerns for symptom recurrence, and we can see her on an as-needed basis.    She understands and is quite satisfied with her result even now.      Electronically  Signed: Corrie Mckusick 12/03/2019, 10:41 AM   I spent a total of    15 Minutes in remote  clinical consultation, greater than 50% of which was counseling/coordinating care for SP uterine artery embolization.    Visit type: Audio and video (webex).   Alternative for in-person consultation at [facility], [address], [place], [place]. This visit type was conducted due to national recommendations for restrictions regarding the COVID-19 Pandemic (e.g. social distancing).  This format is felt to be most appropriate for this patient at this time.  All issues noted in this document were discussed and addressed.

## 2021-07-10 IMAGING — MR MR PELVIS WO/W CM
10 of 14 series · 19 of 48 positions shown · IV contrast (gadavist)
Comparison: None.

CLINICAL DATA: Uterine fibroids, pre UFE

EXAM:
MRI PELVIS WITHOUT AND WITH CONTRAST
TECHNIQUE: Multiplanar multisequence MR imaging of the pelvis was performed
both before and after administration of intravenous contrast.
CONTRAST:  7.5mL GADAVIST GADOBUTROL 1 MMOL/ML IV SOLN

[Series 3: T2 · coronal · 6.0mm · 0.86mm/px · 2 of 26 slices shown (1 of 4)]
[im 1/26]
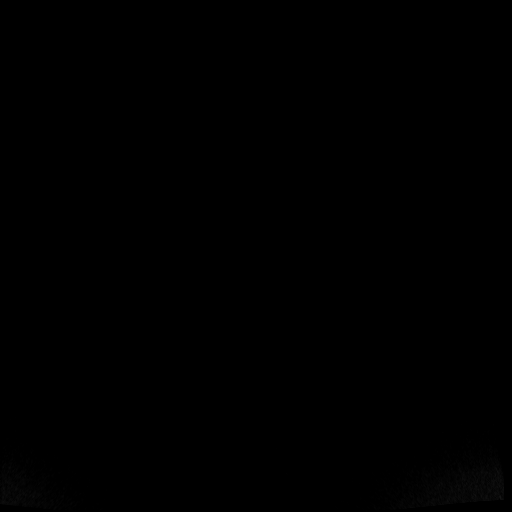
[im 26/26]
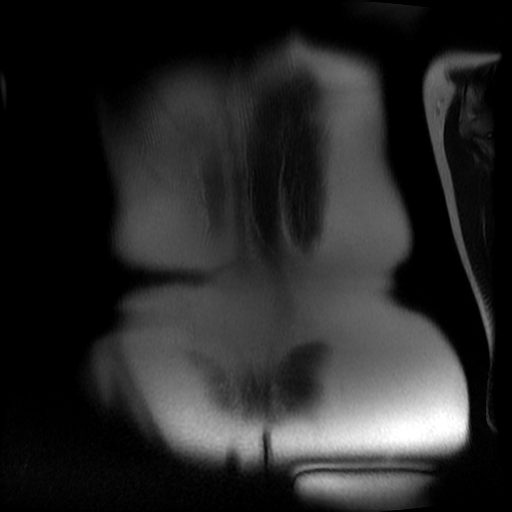

[Series 6: T2 · coronal · 5.0mm · 0.47mm/px · 2 of 29 slices shown (2 of 4)]
[im 1/29]
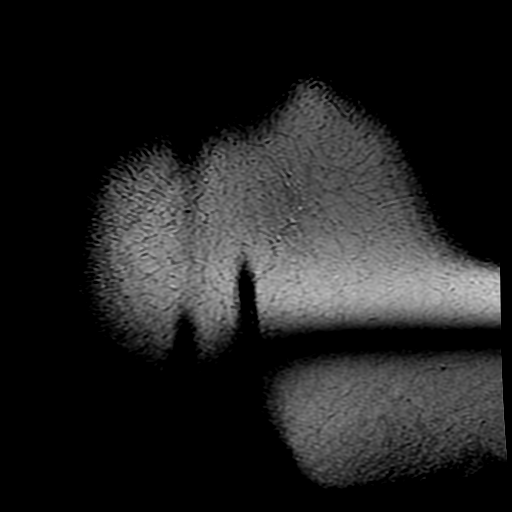
[im 29/29]
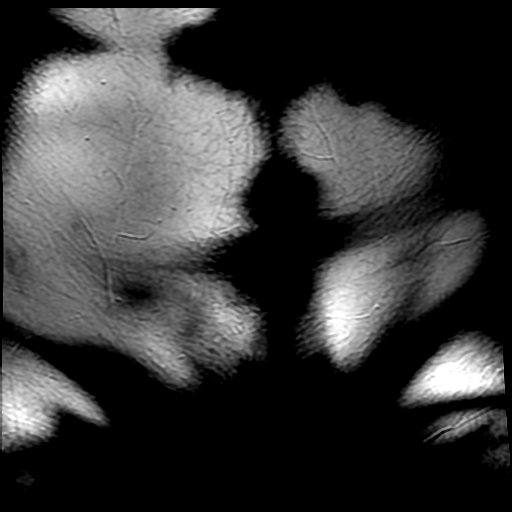

[Series 7: T2 · axial · 5.0mm · 0.47mm/px · z∈[-305,-77]mm · 2 of 39 slices shown (3 of 4)]
[im 1/39]
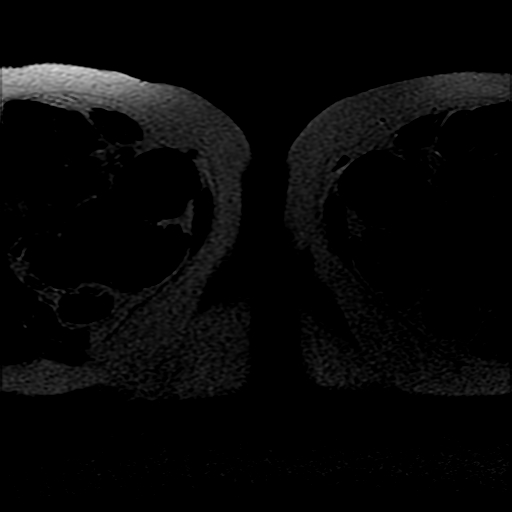
[im 39/39]
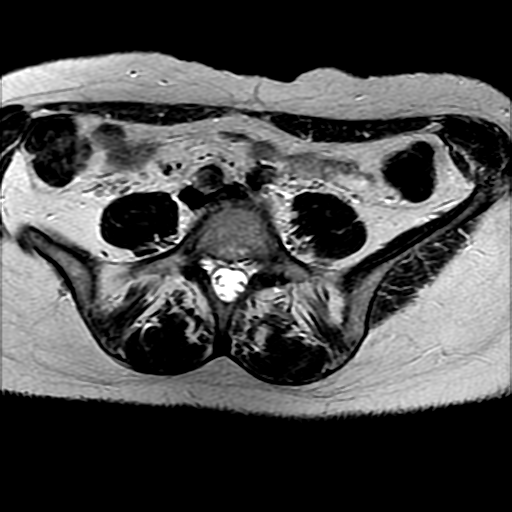

[Series 8: T2 fat-sat · axial · 5.0mm · 0.47mm/px · z∈[-305,-77]mm · 2 of 39 slices shown]
[im 1/39]
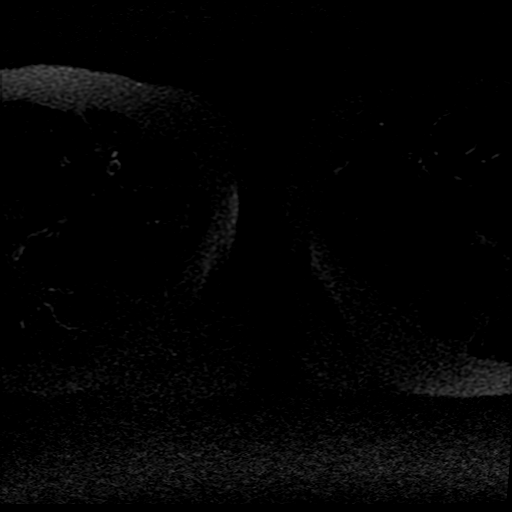
[im 39/39]
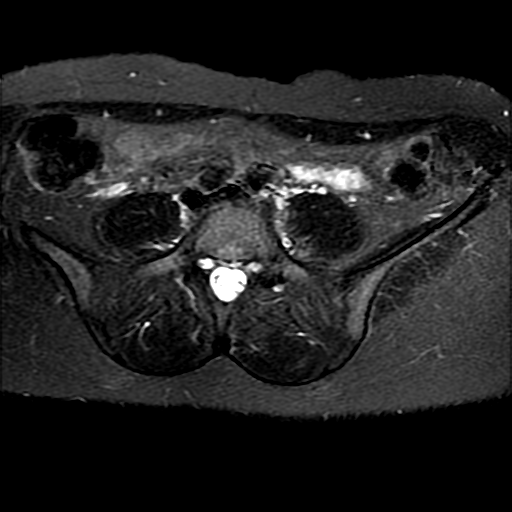

[Series 9: T2 · sagittal · 5.0mm · 0.47mm/px · 2 of 28 slices shown (4 of 4)]
[im 1/28]
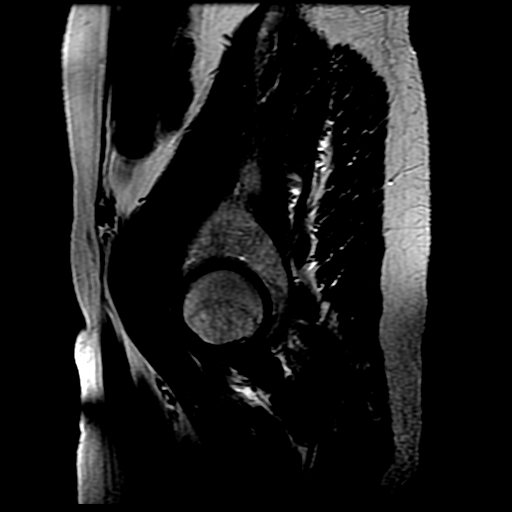
[im 28/28]
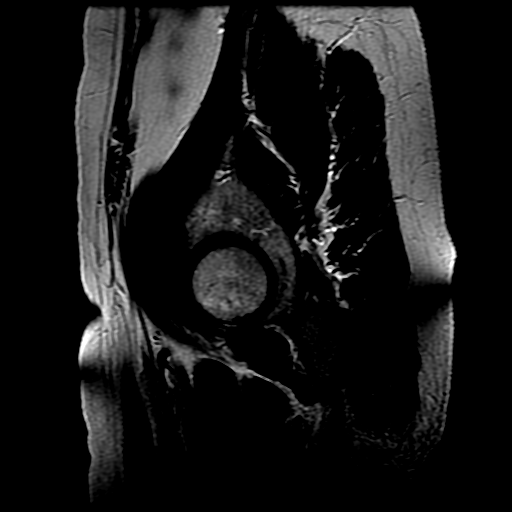

[Series 10: T1 · axial · 5.0mm · 0.47mm/px · z∈[-305,-77]mm · 2 of 39 slices shown]
[im 1/39]
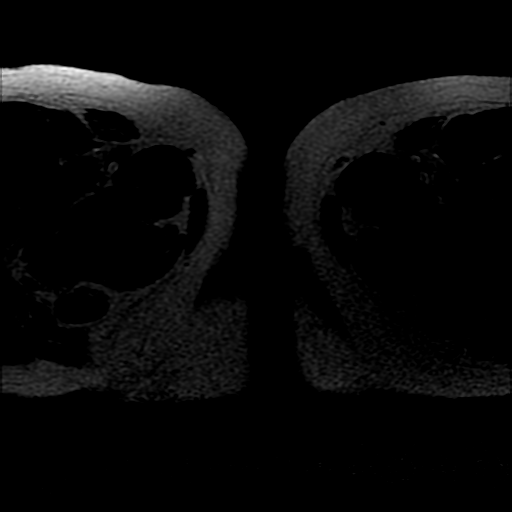
[im 39/39]
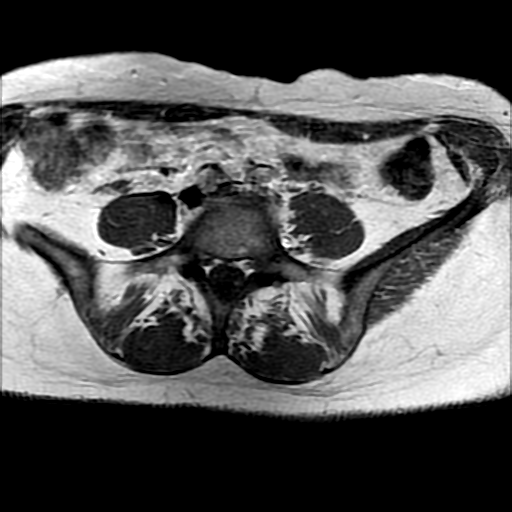

[Series 12: T2 post-contrast · sagittal · 5.0mm · 0.47mm/px · 2 of 28 slices shown]
[im 1/28]
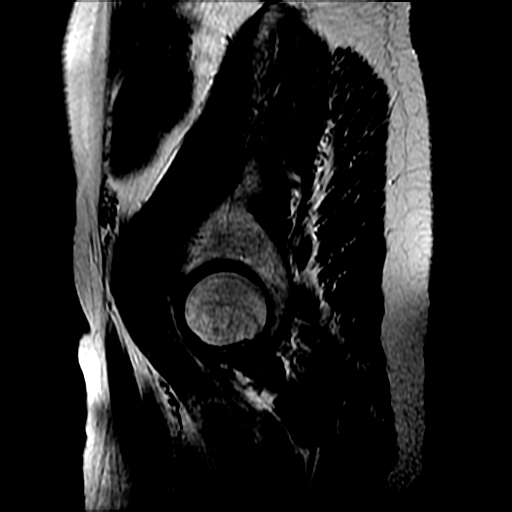
[im 28/28]
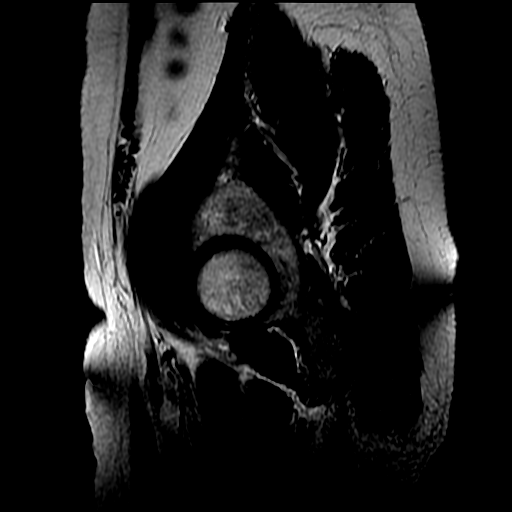

[Series 13: T1 fat-sat post-contrast · axial · 5.0mm · 0.47mm/px · z∈[-305,-77]mm · 2 of 39 slices shown (1 of 2)]
[im 1/39]
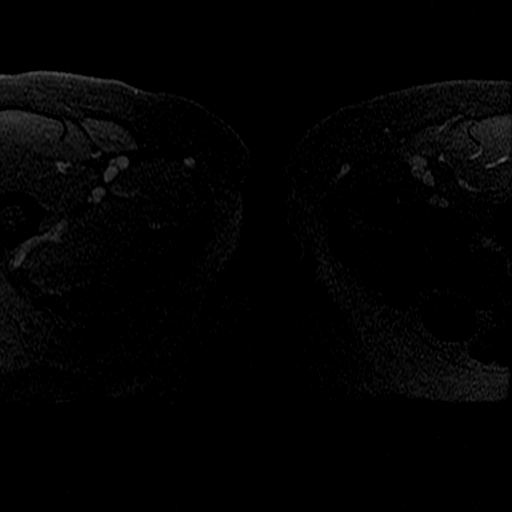
[im 39/39]
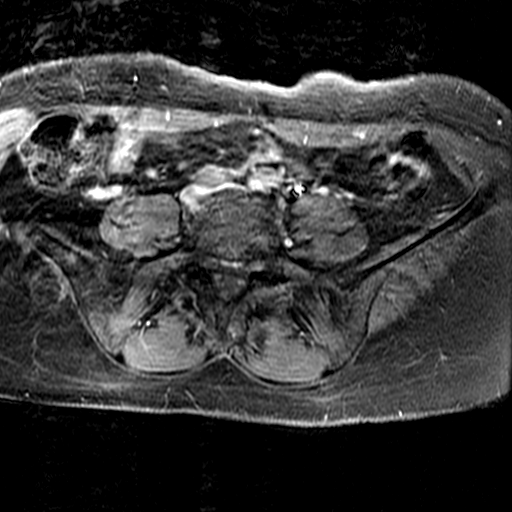

[Series 14: T1 fat-sat post-contrast · coronal · 5.0mm · 0.47mm/px · 2 of 29 slices shown (2 of 2)]
[im 1/29]
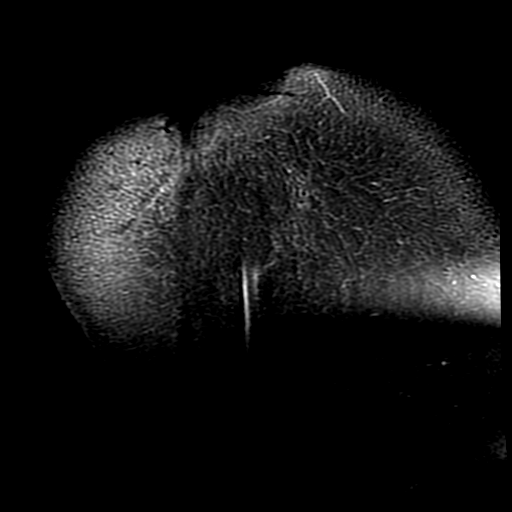
[im 29/29]
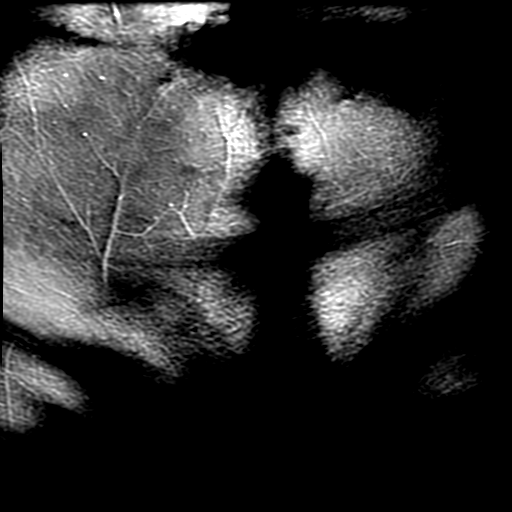

[Series 1100: T1 dynamic · axial · 5.0mm · 0.47mm/px · 1 of 96 slices shown]
[im 1/96]
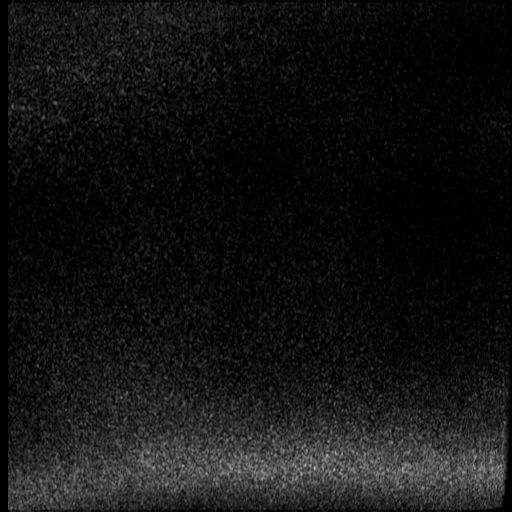

[19 of 48 positions shown; findings below may reference images not displayed]

FINDINGS: Urinary Tract:  Bladder is within normal limits.

No hydronephrosis.

Bowel:  Visualized bowel is unremarkable.

Vascular/Lymphatic: No evidence of aneurysm.

No suspicious pelvic lymphadenopathy.

Reproductive: Retroverted uterus. Uterus measures 6.0 x 6.6 x
cm. Dominant 5.0 x 4.4 x 4.9 cm submucosal fibroid (series 6/image
12) in the anterior uterine body with 40% mucosal component,
distorting/displacing the endometrial complex, which measures 3 mm
(series 9/image 11). Fibroid uniformly enhances following contrast
administration.

Right ovary measures 1.8 x 1.5 x 1.9 cm and is notable for a
dominant 11 mm follicle (series 7/image 17), within normal limits.

Left ovary measures 1.8 x 1.3 x 1.5 cm and is within normal limits
(series 7/image 17).

Other:  No pelvic ascites.

Musculoskeletal: Prominent bilateral L5 and S1 nerve root
diverticuli (pseudomeningoceles), an anatomic variant (series
8/image 6).

Mild degenerative changes at L5-S1.
IMPRESSION: Dominant 5.0 cm submucosal fibroid in the anterior uterine body with
40% mucosal component, as above. Fibroid uniformly enhances
following contrast administration.
